# Patient Record
Sex: Female | Born: 1975 | Race: White | Hispanic: No | Marital: Married | Smoking: Never smoker
Health system: Southern US, Community
[De-identification: ages and names within clinical notes are randomized; demographics above are authoritative.]

## PROBLEM LIST (undated history)

## (undated) ENCOUNTER — Emergency Department (HOSPITAL_COMMUNITY): Disposition: A | Discharge status: Home / Self Care | Payer: BLUE CROSS/BLUE SHIELD

## (undated) DIAGNOSIS — T7840XA Allergy, unspecified, initial encounter: Secondary | ICD-10-CM

## (undated) DIAGNOSIS — E785 Hyperlipidemia, unspecified: Secondary | ICD-10-CM

## (undated) DIAGNOSIS — E282 Polycystic ovarian syndrome: Secondary | ICD-10-CM

## (undated) HISTORY — DX: Hyperlipidemia, unspecified: E78.5

## (undated) HISTORY — DX: Polycystic ovarian syndrome: E28.2

## (undated) HISTORY — PX: COLONOSCOPY: SHX174

## (undated) HISTORY — DX: Allergy, unspecified, initial encounter: T78.40XA

---

## 2000-05-20 ENCOUNTER — Other Ambulatory Visit: Admission: RE | Admit: 2000-05-20 | Discharge: 2000-05-20 | Payer: Self-pay | Admitting: Obstetrics and Gynecology

## 2000-10-10 ENCOUNTER — Emergency Department (HOSPITAL_COMMUNITY): Admission: EM | Admit: 2000-10-10 | Discharge: 2000-10-11 | Payer: Self-pay | Admitting: Emergency Medicine

## 2002-08-20 ENCOUNTER — Other Ambulatory Visit: Admission: RE | Admit: 2002-08-20 | Discharge: 2002-08-20 | Payer: Self-pay | Admitting: Obstetrics and Gynecology

## 2003-08-23 ENCOUNTER — Other Ambulatory Visit: Admission: RE | Admit: 2003-08-23 | Discharge: 2003-08-23 | Payer: Self-pay | Admitting: Obstetrics and Gynecology

## 2004-09-21 ENCOUNTER — Other Ambulatory Visit: Admission: RE | Admit: 2004-09-21 | Discharge: 2004-09-21 | Payer: Self-pay | Admitting: Obstetrics and Gynecology

## 2005-08-09 ENCOUNTER — Other Ambulatory Visit: Admission: RE | Admit: 2005-08-09 | Discharge: 2005-08-09 | Payer: Self-pay | Admitting: Obstetrics and Gynecology

## 2005-10-06 ENCOUNTER — Encounter: Payer: Self-pay | Admitting: Vascular Surgery

## 2005-10-06 ENCOUNTER — Ambulatory Visit (HOSPITAL_COMMUNITY): Admission: RE | Admit: 2005-10-06 | Discharge: 2005-10-06 | Payer: Self-pay | Admitting: Obstetrics and Gynecology

## 2005-10-19 ENCOUNTER — Ambulatory Visit: Payer: Self-pay | Admitting: Family Medicine

## 2006-02-17 ENCOUNTER — Inpatient Hospital Stay (HOSPITAL_COMMUNITY): Admission: AD | Admit: 2006-02-17 | Discharge: 2006-02-21 | Payer: Self-pay | Admitting: Obstetrics and Gynecology

## 2006-07-08 ENCOUNTER — Ambulatory Visit (HOSPITAL_BASED_OUTPATIENT_CLINIC_OR_DEPARTMENT_OTHER): Admission: RE | Admit: 2006-07-08 | Discharge: 2006-07-08 | Payer: Self-pay | Admitting: Orthopedic Surgery

## 2006-07-08 HISTORY — PX: CARPAL TUNNEL RELEASE: SHX101

## 2007-06-29 ENCOUNTER — Telehealth (INDEPENDENT_AMBULATORY_CARE_PROVIDER_SITE_OTHER): Payer: Self-pay | Admitting: *Deleted

## 2007-06-30 ENCOUNTER — Ambulatory Visit: Payer: Self-pay | Admitting: Family Medicine

## 2007-06-30 DIAGNOSIS — J069 Acute upper respiratory infection, unspecified: Secondary | ICD-10-CM | POA: Insufficient documentation

## 2007-06-30 DIAGNOSIS — R05 Cough: Secondary | ICD-10-CM

## 2007-06-30 DIAGNOSIS — R059 Cough, unspecified: Secondary | ICD-10-CM | POA: Insufficient documentation

## 2007-08-02 ENCOUNTER — Telehealth (INDEPENDENT_AMBULATORY_CARE_PROVIDER_SITE_OTHER): Payer: Self-pay | Admitting: *Deleted

## 2007-08-31 ENCOUNTER — Ambulatory Visit: Payer: Self-pay | Admitting: Family Medicine

## 2007-08-31 DIAGNOSIS — E785 Hyperlipidemia, unspecified: Secondary | ICD-10-CM | POA: Insufficient documentation

## 2007-08-31 DIAGNOSIS — J309 Allergic rhinitis, unspecified: Secondary | ICD-10-CM | POA: Insufficient documentation

## 2007-09-04 ENCOUNTER — Encounter (INDEPENDENT_AMBULATORY_CARE_PROVIDER_SITE_OTHER): Payer: Self-pay | Admitting: *Deleted

## 2007-09-05 ENCOUNTER — Encounter: Payer: Self-pay | Admitting: Family Medicine

## 2007-09-12 ENCOUNTER — Encounter (INDEPENDENT_AMBULATORY_CARE_PROVIDER_SITE_OTHER): Payer: Self-pay | Admitting: *Deleted

## 2007-09-20 ENCOUNTER — Encounter: Payer: Self-pay | Admitting: Family Medicine

## 2007-12-19 ENCOUNTER — Encounter: Payer: Self-pay | Admitting: Family Medicine

## 2007-12-21 ENCOUNTER — Telehealth: Payer: Self-pay | Admitting: Family Medicine

## 2007-12-26 ENCOUNTER — Ambulatory Visit: Payer: Self-pay | Admitting: Family Medicine

## 2007-12-26 DIAGNOSIS — N63 Unspecified lump in unspecified breast: Secondary | ICD-10-CM

## 2008-01-02 ENCOUNTER — Encounter: Admission: RE | Admit: 2008-01-02 | Discharge: 2008-01-02 | Payer: Self-pay | Admitting: Family Medicine

## 2008-01-04 ENCOUNTER — Encounter (INDEPENDENT_AMBULATORY_CARE_PROVIDER_SITE_OTHER): Payer: Self-pay | Admitting: *Deleted

## 2008-01-04 LAB — CONVERTED CEMR LAB
ALT: 62 units/L — ABNORMAL HIGH (ref 0–35)
AST: 44 units/L — ABNORMAL HIGH (ref 0–37)
Albumin: 3.9 g/dL (ref 3.5–5.2)
Alkaline Phosphatase: 76 units/L (ref 39–117)
Bilirubin, Direct: 0.1 mg/dL (ref 0.0–0.3)
Cholesterol: 225 mg/dL (ref 0–200)
Direct LDL: 163.6 mg/dL
HDL: 37.2 mg/dL — ABNORMAL LOW (ref >39.0)
Total Bilirubin: 1 mg/dL (ref 0.3–1.2)
Total CHOL/HDL Ratio: 6
Total Protein: 7.1 g/dL (ref 6.0–8.3)
Triglycerides: 127 mg/dL (ref 0–149)
VLDL: 25 mg/dL (ref 0–40)

## 2008-01-24 ENCOUNTER — Telehealth (INDEPENDENT_AMBULATORY_CARE_PROVIDER_SITE_OTHER): Payer: Self-pay | Admitting: *Deleted

## 2008-03-19 ENCOUNTER — Ambulatory Visit: Payer: Self-pay | Admitting: Family Medicine

## 2008-03-22 ENCOUNTER — Ambulatory Visit (HOSPITAL_BASED_OUTPATIENT_CLINIC_OR_DEPARTMENT_OTHER): Admission: RE | Admit: 2008-03-22 | Discharge: 2008-03-22 | Payer: Self-pay | Admitting: Orthopedic Surgery

## 2008-06-21 ENCOUNTER — Encounter: Admission: RE | Admit: 2008-06-21 | Discharge: 2008-06-21 | Payer: Self-pay | Admitting: Family Medicine

## 2008-06-24 ENCOUNTER — Telehealth (INDEPENDENT_AMBULATORY_CARE_PROVIDER_SITE_OTHER): Payer: Self-pay | Admitting: *Deleted

## 2009-02-03 ENCOUNTER — Inpatient Hospital Stay (HOSPITAL_COMMUNITY): Admission: RE | Admit: 2009-02-03 | Discharge: 2009-02-06 | Payer: Self-pay | Admitting: Obstetrics and Gynecology

## 2009-02-22 ENCOUNTER — Inpatient Hospital Stay (HOSPITAL_COMMUNITY): Admission: AD | Admit: 2009-02-22 | Discharge: 2009-02-22 | Payer: Self-pay | Admitting: Obstetrics and Gynecology

## 2009-02-23 ENCOUNTER — Inpatient Hospital Stay (HOSPITAL_COMMUNITY): Admission: AD | Admit: 2009-02-23 | Discharge: 2009-02-23 | Payer: Self-pay | Admitting: Obstetrics and Gynecology

## 2009-08-07 ENCOUNTER — Encounter: Admission: RE | Admit: 2009-08-07 | Discharge: 2009-08-07 | Payer: Self-pay | Admitting: Family Medicine

## 2009-09-04 ENCOUNTER — Ambulatory Visit: Payer: Self-pay | Admitting: Family Medicine

## 2009-09-04 DIAGNOSIS — D239 Other benign neoplasm of skin, unspecified: Secondary | ICD-10-CM | POA: Insufficient documentation

## 2009-09-17 ENCOUNTER — Telehealth: Payer: Self-pay | Admitting: Family Medicine

## 2009-10-16 ENCOUNTER — Telehealth (INDEPENDENT_AMBULATORY_CARE_PROVIDER_SITE_OTHER): Payer: Self-pay | Admitting: *Deleted

## 2009-12-03 ENCOUNTER — Telehealth (INDEPENDENT_AMBULATORY_CARE_PROVIDER_SITE_OTHER): Payer: Self-pay | Admitting: *Deleted

## 2010-07-05 LAB — CONVERTED CEMR LAB
AST: 28 units/L (ref 0–37)
AST: 41 units/L — ABNORMAL HIGH (ref 0–37)
Albumin: 4.2 g/dL (ref 3.5–5.2)
BUN: 10 mg/dL (ref 6–23)
BUN: 12 mg/dL (ref 6–23)
Basophils Absolute: 0 10*3/uL (ref 0.0–0.1)
Basophils Relative: 0 % (ref 0.0–1.0)
Basophils Relative: 0.3 % (ref 0.0–3.0)
Bilirubin, Direct: 0.1 mg/dL (ref 0.0–0.3)
Bilirubin, Direct: 0.1 mg/dL (ref 0.0–0.3)
CO2: 28 meq/L (ref 19–32)
Chloride: 102 meq/L (ref 96–112)
Chloride: 105 meq/L (ref 96–112)
Creatinine, Ser: 0.8 mg/dL (ref 0.4–1.2)
Eosinophils Absolute: 0.2 10*3/uL (ref 0.0–0.6)
GFR calc Af Amer: 125 mL/min
GFR calc non Af Amer: 103 mL/min
HCT: 45.5 % (ref 36.0–46.0)
HCT: 46.2 % — ABNORMAL HIGH (ref 36.0–46.0)
Hemoglobin: 15 g/dL (ref 12.0–15.0)
Lymphocytes Relative: 37.4 % (ref 12.0–46.0)
Lymphs Abs: 2.2 10*3/uL (ref 0.7–4.0)
MCHC: 32 g/dL (ref 30.0–36.0)
Monocytes Relative: 6.2 % (ref 3.0–11.0)
Monocytes Relative: 6.3 % (ref 3.0–12.0)
Neutro Abs: 3.4 10*3/uL (ref 1.4–7.7)
Neutro Abs: 3.8 10*3/uL (ref 1.4–7.7)
Neutrophils Relative %: 54.1 % (ref 43.0–77.0)
Neutrophils Relative %: 54.5 % (ref 43.0–77.0)
Nitrite: NEGATIVE
Platelets: 214 10*3/uL (ref 150–400)
Potassium: 3.8 meq/L (ref 3.5–5.1)
Potassium: 4.1 meq/L (ref 3.5–5.1)
RBC: 5.13 M/uL — ABNORMAL HIGH (ref 3.87–5.11)
RDW: 12 % (ref 11.5–14.6)
Sodium: 141 meq/L (ref 135–145)
Total Protein: 6.8 g/dL (ref 6.0–8.3)
Total Protein: 6.9 g/dL (ref 6.0–8.3)
WBC Urine, dipstick: NEGATIVE
WBC: 6.2 10*3/uL (ref 4.5–10.5)
WBC: 7.1 10*3/uL (ref 4.5–10.5)
pH: 6.5

## 2010-07-09 NOTE — Progress Notes (Signed)
Summary: rx new dose - niaspan  Phone Note Call from Patient Call back at Home Phone [phone]   Caller: Patient Summary of Call: patient is taking niaspan 500 mg - she was told to increase dose to 1000 mg after 30 days - needs new rx  [facility] - phone [phone] Initial call taken by: Okey Regal [place],  Oct 16, 2009 9:04 AM    Prescriptions: NIASPAN 500 MG CR-TABS (NIACIN (ANTIHYPERLIPIDEMIC)) 2 by mouth at bedtime.  #60 x 1   Entered by:   Army Fossa CMA   Authorized by:   Loreen Freud DO   Signed by:   Army Fossa CMA on 10/16/2009   Method used:   Electronically to        [facility]* (retail)       [address]       [place], [place]  [address]       Ph: [phone]       Fax: [phone]   RxID:   [id number]

## 2010-07-09 NOTE — Progress Notes (Signed)
Summary: alter/med  Phone Note Call from Patient Call back at Home Phone [phone]   Caller: Patient Summary of Call: pt called back and wanting to know if it would be possible for her to try niaspan instead of the simcor that was suggested.pt use [facility] in [place]. pls advise........................Marland KitchenFelecia Deloach CMA  September 17, 2009 4:41 PM   Follow-up for Phone Call        fine--niaspan 500 mg 1 by mouth at bedtime for 1 month then go to 2 by mouth at bedtime---- I'm sure she knows about the tylenol or asa at night with low fat snack but just make sure Follow-up by: Loreen Freud DO,  September 18, 2009 8:22 AM  Additional Follow-up for Phone Call Additional follow up Details #1::        Pt is aware of all of the above. Army Fossa CMA  September 18, 2009 8:33 AM     New/Updated Medications: NIASPAN 500 MG CR-TABS (NIACIN (ANTIHYPERLIPIDEMIC)) 1 by mouth at bedtime. NIASPAN 500 MG CR-TABS (NIACIN (ANTIHYPERLIPIDEMIC)) 2 by mouth at bedtime. Prescriptions: NIASPAN 500 MG CR-TABS (NIACIN (ANTIHYPERLIPIDEMIC)) 1 by mouth at bedtime.  #30 x 0   Entered by:   Army Fossa CMA   Authorized by:   Loreen Freud DO   Signed by:   Army Fossa CMA on 09/18/2009   Method used:   Electronically to        [facility] (retail)       [address]       [place], [place]  [address]       Ph: [phone]       Fax: [phone]   RxID:   [id number]

## 2010-07-09 NOTE — Assessment & Plan Note (Signed)
Summary: CPX//PH   Vital Signs:  Patient profile:   35 year old female Height:      66.5 inches Weight:      208 pounds BMI:     33.19 Pulse rate:   83 / minute Pulse rhythm:   regular BP sitting:   124 / 78  (left arm) Cuff size:   regular  Vitals Entered By: Army Fossa CMA (September 04, 2009 8:39 AM) CC: Pt here for CPX, no pap    History of Present Illness: Pt here for cpe ---  no pap--- pt sees gyn.  No complaints.    Preventive Screening-Counseling & Management  Alcohol-Tobacco     Alcohol drinks/day: 0     Smoking Status: never     Passive Smoke Exposure: no  Caffeine-Diet-Exercise     Caffeine use/day: 0     Does Patient Exercise: no  Hep-HIV-STD-Contraception     HIV Risk: no     Dental Visit-last 6 months yes     SBE monthly: yes  Safety-Violence-Falls     Seat Belt Use: 100      Sexual History:  currently monogamous.    Current Medications (verified): 1)  None  Allergies: 1)  ! Amoxicillin 2)  ! Prednisone  Past History:  Past Medical History: Last updated: 08/31/2007 Hyperlipidemia Allergic rhinitis  Family History: Last updated: 08/31/2007 Family History High cholesterol Family History Hypertension Family History Lung cancer--PGF  Social History: Last updated: 08/31/2007 Never Smoked Occupation: Pharm. rep. Married Drug use-no Regular exercise-yes Alcohol use-no  Risk Factors: Alcohol Use: 0 (09/04/2009) Caffeine Use: 0 (09/04/2009) Exercise: no (09/04/2009)  Risk Factors: Smoking Status: never (09/04/2009) Passive Smoke Exposure: no (09/04/2009)  Past Surgical History: Caesarean section X2 Carpal tunnel release (2/08)  Family History: Reviewed history from 08/31/2007 and no changes required. Family History High cholesterol Family History Hypertension Family History Lung cancer--PGF  Social History: Reviewed history from 08/31/2007 and no changes required. Never Smoked Occupation: Pharm. rep. Married Drug  use-no Regular exercise-yes Alcohol use-no Does Patient Exercise:  no Dental Care w/in 6 mos.:  yes Sexual History:  currently monogamous  Review of Systems      See HPI General:  Denies chills, fatigue, fever, loss of appetite, malaise, sleep disorder, sweats, weakness, and weight loss. Eyes:  Denies blurring, discharge, double vision, eye irritation, eye pain, halos, itching, light sensitivity, red eye, vision loss-1 eye, and vision loss-both eyes. ENT:  Denies decreased hearing, difficulty swallowing, ear discharge, earache, hoarseness, nasal congestion, nosebleeds, postnasal drainage, ringing in ears, sinus pressure, and sore throat. CV:  Denies bluish discoloration of lips or nails, chest pain or discomfort, difficulty breathing at night, difficulty breathing while lying down, fainting, fatigue, leg cramps with exertion, lightheadness, near fainting, palpitations, shortness of breath with exertion, swelling of feet, swelling of hands, and weight gain. Resp:  Denies chest discomfort, chest pain with inspiration, cough, coughing up blood, excessive snoring, hypersomnolence, morning headaches, pleuritic, shortness of breath, sputum productive, and wheezing. GI:  Denies abdominal pain, bloody stools, change in bowel habits, constipation, dark tarry stools, diarrhea, excessive appetite, gas, hemorrhoids, indigestion, loss of appetite, nausea, vomiting, vomiting blood, and yellowish skin color. GU:  Denies abnormal vaginal bleeding, decreased libido, discharge, dysuria, genital sores, hematuria, incontinence, nocturia, urinary frequency, and urinary hesitancy. MS:  Denies joint pain, joint redness, joint swelling, loss of strength, low back pain, mid back pain, muscle aches, muscle , cramps, muscle weakness, stiffness, and thoracic pain. Derm:  Denies changes in color of  skin, changes in nail beds, dryness, excessive perspiration, flushing, hair loss, insect bite(s), itching, lesion(s), poor wound  healing, and rash. Neuro:  Denies brief paralysis, difficulty with concentration, disturbances in coordination, falling down, headaches, inability to speak, memory loss, numbness, poor balance, seizures, sensation of room spinning, tingling, tremors, visual disturbances, and weakness. Psych:  Denies alternate hallucination ( auditory/visual), anxiety, depression, easily angered, easily tearful, irritability, mental problems, panic attacks, sense of great danger, suicidal thoughts/plans, thoughts of violence, unusual visions or sounds, and thoughts /plans of harming others. Endo:  Denies cold intolerance, excessive hunger, excessive thirst, excessive urination, heat intolerance, polyuria, and weight change. Heme:  Denies abnormal bruising, bleeding, enlarge lymph nodes, fevers, pallor, and skin discoloration. Allergy:  Denies hives or rash, itching eyes, persistent infections, seasonal allergies, and sneezing.  Physical Exam  General:  Well-developed,well-nourished,in no acute distress; alert,appropriate and cooperative throughout examination Head:  Normocephalic and atraumatic without obvious abnormalities. No apparent alopecia or balding. Eyes:  vision grossly intact, pupils equal, pupils round, pupils reactive to light, and no injection.   Ears:  External ear exam shows no significant lesions or deformities.  Otoscopic examination reveals clear canals, tympanic membranes are intact bilaterally without bulging, retraction, inflammation or discharge. Hearing is grossly normal bilaterally. Nose:  External nasal examination shows no deformity or inflammation. Nasal mucosa are pink and moist without lesions or exudates. Mouth:  Oral mucosa and oropharynx without lesions or exudates.  Teeth in good repair. Neck:  No deformities, masses, or tenderness noted.no carotid bruits.   Chest Wall:  No deformities, masses, or tenderness noted. Breasts:  gyn Lungs:  Normal respiratory effort, chest expands  symmetrically. Lungs are clear to auscultation, no crackles or wheezes. Heart:  normal rate and no murmur.   Abdomen:  Bowel sounds positive,abdomen soft and non-tender without masses, organomegaly or hernias noted. Genitalia:  gyn Msk:  normal ROM, no joint tenderness, no joint swelling, no joint warmth, no redness over joints, no joint deformities, no joint instability, and no crepitation.   Pulses:  R posterior tibial normal, R dorsalis pedis normal, and R carotid normal.   Extremities:  No clubbing, cyanosis, edema, or deformity noted with normal full range of motion of all joints.   Neurologic:  alert & oriented X3, cranial nerves II-XII intact, strength normal in all extremities, and gait normal.   Skin:  Intact without suspicious lesions or rashes Cervical Nodes:  No lymphadenopathy noted Axillary Nodes:  No palpable lymphadenopathy Psych:  Cognition and judgment appear intact. Alert and cooperative with normal attention span and concentration. No apparent delusions, illusions, hallucinations   Impression & Recommendations:  Problem # 1:  PREVENTIVE HEALTH CARE (ICD-V70.0) GHM utd check labs Orders: Venipuncture (86578) TLB-BMP (Basic Metabolic Panel-BMET) (80048-METABOL) TLB-CBC Platelet - w/Differential (85025-CBCD) TLB-Hepatic/Liver Function Pnl (80076-HEPATIC) TLB-TSH (Thyroid Stimulating Hormone) (84443-TSH)  Problem # 2:  HYPERLIPIDEMIA (ICD-272.4)  Orders: Venipuncture (46962) TLB-BMP (Basic Metabolic Panel-BMET) (80048-METABOL) TLB-CBC Platelet - w/Differential (85025-CBCD) TLB-Hepatic/Liver Function Pnl (80076-HEPATIC) TLB-TSH (Thyroid Stimulating Hormone) (84443-TSH) T-NMR, Lipoprofile (95[address]-13244)  Labs Reviewed: SGOT: 44 (12/26/2007)   SGPT: 62 (12/26/2007)   HDL:37.2 (12/26/2007)  LDL:DEL (12/26/2007)  Chol:225 (12/26/2007)  Trig:127 (12/26/2007)  Other Orders: Dermatology Referral (Derma)  Appended Document: CPX//PH  Laboratory Results   Urine  Tests   Date/Time Reported: September 04, 2009 1:16 PM   Routine Urinalysis   Color: yellow Appearance: Clear Glucose: negative   (Normal Range: Negative) Bilirubin: negative   (Normal Range: Negative) Ketone: negative   (Normal Range: Negative) Spec. Gravity: <  1.005   (Normal Range: 1.003-1.035) Blood: negative   (Normal Range: Negative) pH: 6.0   (Normal Range: 5.0-8.0) Protein: negative   (Normal Range: Negative) Urobilinogen: 0.2   (Normal Range: 0-1) Nitrite: negative   (Normal Range: Negative) Leukocyte Esterace: negative   (Normal Range: Negative)    Comments: Floydene Flock  September 04, 2009 1:16 PM

## 2010-07-09 NOTE — Progress Notes (Signed)
Summary: PINK EYE  Phone Note Call from Patient Call back at Home Phone [phone]   Caller: Patient Summary of Call: PT STATES SHE HAS PINK EYE. 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 SHE WANTS TO KNOW IF SHE CAN HAVE AN RX CALLED IN FOR THIS. PLEASE CONTACT HER AT [phone]. Initial call taken by: Lavell Islam,  December 03, 2009 10:54 AM  Follow-up for Phone Call        Informed pt that OV would be need for rx to be called in pt would like for me to check with another physician to see if it would be possible to get rx sent in without OV......Marland KitchenFelecia Deloach CMA  December 03, 2009 11:21 AM   pt called and stated that the rx needs to go to [facility] pharmacy phone number [phone]. Follow-up by: Lavell Islam,  December 03, 2009 12:23 PM  Additional Follow-up for Phone Call Additional follow up Details #1::        ok for vigamox.  if no improvement pt will need OV Additional Follow-up by: Neena Rhymes MD,  December 03, 2009 11:34 AM    Additional Follow-up for Phone Call Additional follow up Details #2::    left message to call  office....Marland KitchenMarland KitchenFelecia Deloach CMA  December 03, 2009 11:47 AM   rx cancel and sent to correct pharmacy, left pt detail message rx sent and OVINB.......Marland KitchenFelecia Deloach CMA  December 03, 2009 1:14 PM   New/Updated Medications: VIGAMOX 0.5 % SOLN (MOXIFLOXACIN HCL) 1 drop in affected eye three times a day x7 days. Prescriptions: VIGAMOX 0.5 % SOLN (MOXIFLOXACIN HCL) 1 drop in affected eye three times a day x7 days.  #87ml x 0   Entered by:   Jeremy Johann CMA   Authorized by:   Neena Rhymes MD   Signed by:   Jeremy Johann CMA on 12/03/2009   Method used:   Faxed to ...       [facility]* (retail)       [address]       [place], [place]  [address]       Ph: [phone]       Fax: [phone]   RxID:   [id number] VIGAMOX 0.5 % SOLN (MOXIFLOXACIN HCL) 1 drop in affected eye three times a day x7 days.  #70ml x  0   Entered and Authorized by:   Neena Rhymes MD   Signed by:   Neena Rhymes MD on 12/03/2009   Method used:   Electronically to        [facility] (retail)       [address]       [place], [place]  [address]       Ph: [phone]       Fax: [phone]   RxID:   [id number]

## 2010-07-27 ENCOUNTER — Telehealth (INDEPENDENT_AMBULATORY_CARE_PROVIDER_SITE_OTHER): Payer: Self-pay | Admitting: *Deleted

## 2010-08-04 NOTE — Progress Notes (Signed)
Summary: Vaccinations  Phone Note Call from Patient Call back at Home Phone [phone]   Caller: Patient Summary of Call: pt is going on a mission trip to [place] and will need vaccinations. Please contact patient at [phone]. Pt has a list of the ones she needs but she didnt know if there are any others that she needed. Pt really just wants the MD's opinion.  Initial call taken by: Lavell Islam,  July 27, 2010 11:08 AM  Follow-up for Phone Call        Pt given travel clinic number....Marland KitchenMarland KitchenFelecia Deloach CMA  July 27, 2010 11:28 AM

## 2010-08-13 ENCOUNTER — Encounter: Payer: Self-pay | Admitting: Family Medicine

## 2010-08-13 ENCOUNTER — Other Ambulatory Visit: Payer: Self-pay | Admitting: Family Medicine

## 2010-08-13 ENCOUNTER — Ambulatory Visit (HOSPITAL_BASED_OUTPATIENT_CLINIC_OR_DEPARTMENT_OTHER)
Admission: RE | Admit: 2010-08-13 | Discharge: 2010-08-13 | Disposition: A | Discharge status: Home / Self Care | Payer: Managed Care, Other (non HMO) | Source: Ambulatory Visit | Attending: Family Medicine | Admitting: Family Medicine

## 2010-08-13 ENCOUNTER — Ambulatory Visit (INDEPENDENT_AMBULATORY_CARE_PROVIDER_SITE_OTHER): Payer: Managed Care, Other (non HMO) | Admitting: Family Medicine

## 2010-08-13 DIAGNOSIS — J40 Bronchitis, not specified as acute or chronic: Secondary | ICD-10-CM | POA: Insufficient documentation

## 2010-08-13 DIAGNOSIS — J209 Acute bronchitis, unspecified: Secondary | ICD-10-CM | POA: Insufficient documentation

## 2010-08-13 DIAGNOSIS — R0602 Shortness of breath: Secondary | ICD-10-CM | POA: Insufficient documentation

## 2010-08-18 NOTE — Assessment & Plan Note (Signed)
Summary: resp. problem/kn   Vital Signs:  Patient profile:   35 year old female Height:      66.5 inches Weight:      202.4 pounds BMI:     32.30 Temp:     97.6 degrees F oral BP sitting:   120 / 76  (right arm) Cuff size:   large  Vitals Entered By: Almeta Monas CMA Duncan Dull) (August 13, 2010 1:20 PM) CC: x 8 weeks c/o Uri with cough----took biaxin for 2 weeks with no relief, Cough   History of Present Illness:  Cough      This is a 35 year old woman who presents with Cough.  The symptoms began 4-8 weeks ago.  The patient reports productive cough, wheezing, and fever, but denies pleuritic chest pain, shortness of breath, exertional dyspnea, hemoptysis, and malaise.  The patient denies the following symptoms: cold/URI symptoms, sore throat, nasal congestion, chronic rhinitis, weight loss, acid reflux symptoms, and peripheral edema.  The cough is worse with activity and lying down.  Ineffective prior treatments have included OTC cough medication.    Current Medications (verified): 1)  Avelox 400 Mg Tabs (Moxifloxacin Hcl) .Marland Kitchen.. 1 By Mouth Once Daily 2)  Proair Hfa 108 (90 Base) Mcg/act Aers (Albuterol Sulfate) .... 2 Puffs Qid As Needed  Allergies (verified): 1)  ! Amoxicillin 2)  ! Prednisone  Past History:  Past medical, surgical, family and social histories (including risk factors) reviewed for relevance to current acute and chronic problems.  Past Medical History: Reviewed history from 08/31/2007 and no changes required. Hyperlipidemia Allergic rhinitis  Past Surgical History: Reviewed history from 09/04/2009 and no changes required. Caesarean section X2 Carpal tunnel release (2/08)  Family History: Reviewed history from 08/31/2007 and no changes required. Family History High cholesterol Family History Hypertension Family History Lung cancer--PGF  Social History: Reviewed history from 08/31/2007 and no changes required. Never Smoked Occupation: Pharm.  rep. Married Drug use-no Regular exercise-yes Alcohol use-no  Review of Systems      See HPI  Physical Exam  General:  Well-developed,well-nourished,in no acute distress; alert,appropriate and cooperative throughout examination Ears:  External ear exam shows no significant lesions or deformities.  Otoscopic examination reveals clear canals, tympanic membranes are intact bilaterally without bulging, retraction, inflammation or discharge. Hearing is grossly normal bilaterally. Nose:  External nasal examination shows no deformity or inflammation. Nasal mucosa are pink and moist without lesions or exudates. Mouth:  Oral mucosa and oropharynx without lesions or exudates.  Teeth in good repair. Neck:  No deformities, masses, or tenderness noted. Lungs:  R decreased breath sounds and L decreased breath sounds.   Heart:  Normal rate and regular rhythm. S1 and S2 normal without gallop, murmur, click, rub or other extra sounds. Psych:  Cognition and judgment appear intact. Alert and cooperative with normal attention span and concentration. No apparent delusions, illusions, hallucinations   Impression & Recommendations:  Problem # 1:  ACUTE BRONCHITIS (ICD-466.0)  Her updated medication list for this problem includes:    Avelox 400 Mg Tabs (Moxifloxacin hcl) .Marland Kitchen... 1 by mouth once daily    Proair Hfa 108 (90 Base) Mcg/act Aers (Albuterol sulfate) .Marland Kitchen... 2 puffs qid as needed  Orders: T-2 View CXR (71020TC)  Take antibiotics and other medications as directed. Encouraged to push clear liquids, get enough rest, and take acetaminophen as needed. To be seen in 5-7 days if no improvement, sooner if worse.  Complete Medication List: 1)  Avelox 400 Mg Tabs (Moxifloxacin hcl) .Marland KitchenMarland KitchenMarland Kitchen  1 by mouth once daily 2)  Proair Hfa 108 (90 Base) Mcg/act Aers (Albuterol sulfate) .... 2 puffs qid as needed Prescriptions: PROAIR HFA 108 (90 BASE) MCG/ACT AERS (ALBUTEROL SULFATE) 2 puffs qid as needed  #1 x 1    Entered and Authorized by:   Loreen Freud DO   Signed by:   Loreen Freud DO on 08/13/2010   Method used:   Electronically to        [facility]* (retail)       [address]       [place], [place]  [address]       Ph: [phone]       Fax: [phone]   RxID:   [id number] PREDNISONE 10 MG TABS (PREDNISONE) 3 by mouth once daily for 3 days then 2 by mouth once daily for 3 days then 1 by mouth once daily for 3 days  #18 x 0   Entered and Authorized by:   Loreen Freud DO   Signed by:   Loreen Freud DO on 08/13/2010   Method used:   Electronically to        [facility]* (retail)       [address]       [place], [place]  [address]       Ph: [phone]       Fax: [phone]   RxID:   [id number] AVELOX 400 MG TABS (MOXIFLOXACIN HCL) 1 by mouth once daily  #10 x 0   Entered and Authorized by:   Loreen Freud DO   Signed by:   Loreen Freud DO on 08/13/2010   Method used:   Electronically to        [facility]* (retail)       [address]       [place], [place]  [address]       Ph: [phone]       Fax: [phone]   RxID:   [id number]    Orders Added: 1)  T-2 View CXR [71020TC] 2)  Est. Patient Level III [51761]

## 2010-08-19 ENCOUNTER — Encounter: Payer: Self-pay | Admitting: Family Medicine

## 2010-09-07 ENCOUNTER — Encounter: Payer: Self-pay | Admitting: Family Medicine

## 2010-09-07 ENCOUNTER — Ambulatory Visit (INDEPENDENT_AMBULATORY_CARE_PROVIDER_SITE_OTHER): Payer: Managed Care, Other (non HMO) | Admitting: Family Medicine

## 2010-09-07 VITALS — BP 112/70 | HR 88 | Ht 66.0 in | Wt 199.8 lb

## 2010-09-07 DIAGNOSIS — Z23 Encounter for immunization: Secondary | ICD-10-CM

## 2010-09-07 DIAGNOSIS — Z0000 Encounter for general adult medical examination without abnormal findings: Secondary | ICD-10-CM | POA: Insufficient documentation

## 2010-09-07 LAB — POCT URINALYSIS DIPSTICK
Blood, UA: NEGATIVE
Clarity, UA: NEGATIVE
Leukocytes, UA: NEGATIVE
Protein, UA: NEGATIVE
Spec Grav, UA: 1.02
Urobilinogen, UA: NEGATIVE
pH, UA: 6

## 2010-09-07 LAB — BASIC METABOLIC PANEL
BUN: 15 mg/dL (ref 6–23)
CO2: 28 mEq/L (ref 19–32)
Chloride: 99 mEq/L (ref 96–112)
Glucose, Bld: 75 mg/dL (ref 70–99)
Potassium: 3.9 mEq/L (ref 3.5–5.1)
Sodium: 137 mEq/L (ref 135–145)

## 2010-09-07 LAB — CBC WITH DIFFERENTIAL/PLATELET
Basophils Absolute: 0 10*3/uL (ref 0.0–0.1)
Eosinophils Relative: 2.6 % (ref 0.0–5.0)
HCT: 44.5 % (ref 36.0–46.0)
Hemoglobin: 15.4 g/dL — ABNORMAL HIGH (ref 12.0–15.0)
MCV: 89.5 fl (ref 78.0–100.0)

## 2010-09-07 LAB — HEPATIC FUNCTION PANEL
ALT: 60 U/L — ABNORMAL HIGH (ref 0–35)
AST: 37 U/L (ref 0–37)
Albumin: 4.4 g/dL (ref 3.5–5.2)
Alkaline Phosphatase: 71 U/L (ref 39–117)
Bilirubin, Direct: 0.1 mg/dL (ref 0.0–0.3)
Total Bilirubin: 0.7 mg/dL (ref 0.3–1.2)
Total Protein: 7.1 g/dL (ref 6.0–8.3)

## 2010-09-07 LAB — LDL CHOLESTEROL, DIRECT: Direct LDL: 170.4 mg/dL

## 2010-09-07 LAB — LIPID PANEL
HDL: 49.6 mg/dL (ref >39.00)
Total CHOL/HDL Ratio: 5
VLDL: 26.6 mg/dL (ref 0.0–40.0)

## 2010-09-07 MED ORDER — TYPHOID VACCINE PO CPDR: 4.0000 | DELAYED_RELEASE_CAPSULE | ORAL | Status: AC

## 2010-09-07 MED ORDER — ATOVAQUONE-PROGUANIL HCL 250-100 MG PO TABS: ORAL_TABLET | ORAL | Status: DC

## 2010-09-07 MED ORDER — EPINEPHRINE 0.3 MG/0.3ML IJ DEVI: 0.3000 mg | Freq: Once | INTRAMUSCULAR | Status: AC

## 2010-09-07 MED ORDER — PNEUMOCOCCAL VAC POLYVALENT 25 MCG/0.5ML IJ INJ: 0.5000 mL | INJECTION | Freq: Once | INTRAMUSCULAR | Status: DC

## 2010-09-07 MED ORDER — TETANUS-DIPHTH-ACELL PERTUSSIS 5-2.5-18.5 LF-MCG/0.5 IM SUSP: 0.5000 mL | Freq: Once | INTRAMUSCULAR | Status: DC

## 2010-09-07 NOTE — Progress Notes (Signed)
  Subjective:    Patient ID: Jill Horn, female    DOB: Feb 11, 1976, 35 y.o.   MRN: [id number]  HPI    Review of Systems     Objective:   Physical Exam        Assessment & Plan:   Subjective:     Jill Horn is a 35 y.o. female and is here for a comprehensive physical exam. The patient reports no problems.  History   Social History  . Marital Status: Married    Spouse Name: N/A    Number of Children: N/A  . Years of Education: N/A   Occupational History  . Pharm Rep    Social History Main Topics  . Smoking status: Never Smoker   . Smokeless tobacco: Never Used  . Alcohol Use: No  . Drug Use: No  . Sexually Active: Yes    Birth Control/ Protection: None   Other Topics Concern  . Not on file   Social History Narrative  . No narrative on file   Health Maintenance  Topic Date Due  . Pap Smear  09/16/1992  . Tetanus/tdap  08/30/2017    The following portions of the patient's history were reviewed and updated as appropriate: allergies, current medications, past family history, past medical history, past social history, past surgical history and problem list.  Review of Systems A comprehensive review of systems was negative.   Objective:    BP 112/70  Pulse 88  Ht 5\' 6"  (1.676 m)  Wt 199 lb 12.8 oz (90.629 kg)  BMI 32.25 kg/m2  LMP 08/25/2010 General appearance: alert, cooperative, appears stated age and no distress Head: Normocephalic, without obvious abnormality, atraumatic Eyes: conjunctivae/corneas clear. PERRL, EOM's intact. Fundi benign. Ears: normal TM's and external ear canals both ears Nose: Nares normal. Septum midline. Mucosa normal. No drainage or sinus tenderness. Throat: lips, mucosa, and tongue normal; teeth and gums normal Neck: no adenopathy, no carotid bruit, no JVD, supple, symmetrical, trachea midline and thyroid not enlarged, symmetric, no tenderness/mass/nodules Lungs: clear to auscultation bilaterally Heart:  regular rate and rhythm, S1, S2 normal, no murmur, click, rub or gallop Abdomen: soft, non-tender; bowel sounds normal; no masses,  no organomegaly Extremities: extremities normal, atraumatic, no cyanosis or edema Pulses: 2+ and symmetric Skin: Skin color, texture, turgor normal. No rashes or lesions Lymph nodes: Cervical, supraclavicular, and axillary nodes normal.    Assessment:    Healthy female exam.      Plan:     See After Visit Summary for Counseling Recommendations

## 2010-09-07 NOTE — Assessment & Plan Note (Signed)
Check labs ghm utd Immunizations reviewed with pt for [place] trip

## 2010-09-08 ENCOUNTER — Encounter: Payer: Self-pay | Admitting: *Deleted

## 2010-09-08 LAB — VARICELLA ZOSTER ANTIBODY, IGG: Varicella IgG: 4.03 {ISR} — ABNORMAL HIGH

## 2010-09-08 LAB — RUBEOLA ANTIBODY IGG: Rubeola IgG: 1.73 {ISR} — ABNORMAL HIGH

## 2010-09-09 ENCOUNTER — Encounter: Payer: Self-pay | Admitting: *Deleted

## 2010-09-12 LAB — CBC
Hemoglobin: 13.9 g/dL (ref 12.0–15.0)
MCHC: 34 g/dL (ref 30.0–36.0)
Platelets: 134 10*3/uL — ABNORMAL LOW (ref 150–400)
RBC: 4.13 MIL/uL (ref 3.87–5.11)
RBC: 4.35 MIL/uL (ref 3.87–5.11)
RDW: 14.3 % (ref 11.5–15.5)

## 2010-09-12 LAB — CCBB MATERNAL DONOR DRAW

## 2010-09-12 LAB — RPR: RPR Ser Ql: NONREACTIVE

## 2010-09-15 ENCOUNTER — Other Ambulatory Visit (INDEPENDENT_AMBULATORY_CARE_PROVIDER_SITE_OTHER): Payer: Managed Care, Other (non HMO)

## 2010-09-15 DIAGNOSIS — Z0000 Encounter for general adult medical examination without abnormal findings: Secondary | ICD-10-CM

## 2010-09-15 LAB — HEPATIC FUNCTION PANEL
AST: 36 U/L (ref 0–37)
Alkaline Phosphatase: 73 U/L (ref 39–117)
Total Bilirubin: 0.8 mg/dL (ref 0.3–1.2)
Total Protein: 6.9 g/dL (ref 6.0–8.3)

## 2010-09-15 LAB — GAMMA GT: GGT: 22 U/L (ref 7–51)

## 2010-09-17 ENCOUNTER — Encounter: Payer: Self-pay | Admitting: *Deleted

## 2010-09-17 ENCOUNTER — Telehealth: Payer: Self-pay | Admitting: *Deleted

## 2010-09-17 NOTE — Telephone Encounter (Signed)
Pt aware, copy of labs given to Pt

## 2010-09-17 NOTE — Telephone Encounter (Addendum)
Left message to call office   Message copied by Candie Echevaria on Thu Sep 17, 2010 10:43 AM ------      Message from: Loreen Freud      Created: Tue Sep 15, 2010  5:18 PM       Better but still elevated      [place] abd if not already done

## 2010-09-22 ENCOUNTER — Other Ambulatory Visit: Payer: Managed Care, Other (non HMO)

## 2010-09-23 ENCOUNTER — Ambulatory Visit
Admission: RE | Admit: 2010-09-23 | Discharge: 2010-09-23 | Disposition: A | Discharge status: Home / Self Care | Payer: Managed Care, Other (non HMO) | Source: Ambulatory Visit | Attending: Family Medicine | Admitting: Family Medicine

## 2010-09-23 ENCOUNTER — Other Ambulatory Visit: Payer: Managed Care, Other (non HMO)

## 2010-09-23 ENCOUNTER — Encounter: Payer: Self-pay | Admitting: *Deleted

## 2010-09-23 ENCOUNTER — Telehealth: Payer: Self-pay | Admitting: *Deleted

## 2010-09-23 NOTE — Telephone Encounter (Signed)
Pt aware of results  [place] ---fatty liver. Weight loss and exercise will help this.  ----- Message ----- From: Candie Echevaria, CMA Sent: 09/23/2010 1:13 PM To: Lelon Perla, DO

## 2010-09-24 ENCOUNTER — Telehealth: Payer: Self-pay

## 2010-09-24 MED ORDER — FLUTICASONE PROPIONATE 50 MCG/ACT NA SUSP: 2.0000 | Freq: Every day | NASAL | Status: DC

## 2010-09-24 MED ORDER — CLARITHROMYCIN ER 500 MG PO TB24: 1000.0000 mg | ORAL_TABLET | Freq: Every day | ORAL | Status: AC

## 2010-09-24 NOTE — Telephone Encounter (Signed)
Call from patient and she stated she had a URI with cough, teeth pain and sinus issues.  Stated she is leaving for a mission trip on Sunday and doesn't want to be sick. Would like Rx called to [facility]    Please advise    KP

## 2010-09-24 NOTE — Telephone Encounter (Signed)
flonase 2 sprays each nostril qd  #1 biaxin xl  2 po qd for 14 days

## 2010-09-24 NOTE — Telephone Encounter (Signed)
Pt aware rx sent to pharmacy.

## 2010-10-01 ENCOUNTER — Telehealth: Payer: Self-pay | Admitting: *Deleted

## 2010-10-01 MED ORDER — MOXIFLOXACIN HCL 400 MG PO TABS: 400.0000 mg | ORAL_TABLET | Freq: Every day | ORAL | Status: AC

## 2010-10-01 NOTE — Telephone Encounter (Signed)
avelox 400 1 po qd  When is she leaving?  Can she come in for steroids if no better?

## 2010-10-01 NOTE — Telephone Encounter (Signed)
Pt states that she is on the 7th day of biaxin and will finish med today. Pt still c/o green thick mucous, congestion, teeth aching, and sinus issues. Pt would like to know if she needs to get another round of med or what is the next step since she is still no better. Please advise .

## 2010-10-20 NOTE — Op Note (Signed)
Jill Horn, Jill Horn         ACCOUNT NO.:  [id number]   MEDICAL RECORD NO.:  [id number]          PATIENT TYPE:  AMB   LOCATION:  DSC                          FACILITY:  [facility]   PHYSICIAN:  Jill Horn, M.D. DATE OF BIRTH:  [birth year]   DATE OF PROCEDURE:  03/22/2008  DATE OF DISCHARGE:                               OPERATIVE REPORT   PREOPERATIVE DIAGNOSIS:  Entrapment neuropathy, median nerve, right  carpal tunnel.   POSTOPERATIVE DIAGNOSIS:  Entrapment neuropathy, median nerve, right  carpal tunnel.   OPERATION:  Release of right transverse carpal ligament.   OPERATING SURGEON:  Jill Fitch. Sypher, MD   ASSISTANT:  Marveen Reeks Dasnoit, PA-C   ANESTHESIA:  General by LMA.   SUPERVISING ANESTHESIOLOGIST:  Zenon Mayo, MD   INDICATIONS:  Jill Horn is a 32-year woman familiar with  our practice.  She is status post left carpal tunnel release in 2007.  Electrodiagnostic studies in 2007 confirmed bilateral carpal tunnel  syndrome.  She now returns requesting similar surgery on the right.  Her  preoperative clinical exam confirmed median neuropathy at wrist level.   After informed consent, she is brought to the operating room at this  time.   PROCEDURE IN DETAIL:  Jill Horn is brought to the operating  room and placed in supine position upon the operating table.  Following  an anesthesia consult with Dr. Sampson Goon, general anesthesia by LMA was  recommended and accepted by Jill Horn.   Following induction of general anesthesia under Dr. Jarrett Ables direct  supervision, the right arm was prepped with Betadine soap and solution  and sterilely draped.  A pneumatic tourniquet was applied to the  proximal brachium.  Following exsanguination of the right arm with an  Esmarch bandage, the arterial tourniquet was inflated to 220 mmHg.  The  procedure commenced with short incision in line of the ring finger of  the palm.  Subcutaneous tissues  were carefully divided revealing the  palmar fascia.  This was split longitudinally to reveal the common  sensory branch of the median nerve.  These were followed back to the  transverse carpal ligament which was gently isolated from the median  nerve.  Ligament was then released along its ulnar border extending into  the distal forearm.   This widely opened the carpal canal.  No mass or predicaments were  noted.  Bleeding points along margin of the released ligament were  electrocauterized with bipolar current followed by repair of the skin  with intradermal 3-0 Prolene suture.   A compressive dressing was applied with a volar plaster splint  maintaining the wrist in 5 degrees of dorsiflexion.   For aftercare, Jill Horn is provided Darvocet-N 100 one p.o. q.6  h. p.r.n. pain, 20 tablets without refill.   She will use Tylenol and Advil as necessary.   She will return to see [place] for followup in 1 week.      Jill Fitch Horn, M.D.  Electronically Signed     RVS/MEDQ  D:  03/22/2008  T:  03/22/2008  Job:  [id number]

## 2010-10-20 NOTE — Op Note (Signed)
NAMENEJLA, REASOR          ACCOUNT NO.:  [id number]   MEDICAL RECORD NO.:  [id number]          PATIENT TYPE:  INP   LOCATION:  9125                          FACILITY:  [facility]   PHYSICIAN:  Michelle L. Grewal, M.D.DATE OF BIRTH:  February 12, 1976   DATE OF PROCEDURE:  02/03/2009  DATE OF DISCHARGE:                               OPERATIVE REPORT   PREOPERATIVE DIAGNOSES:  Intrauterine pregnancy at 39 weeks and previous  cesarean section.   POSTOPERATIVE DIAGNOSES:  Intrauterine pregnancy at 39 weeks and  previous cesarean section.   PROCEDURE:  Repeat low-transverse cesarean section.   SURGEON:  Michelle L. Grewal, MD   ANESTHESIA:  Spinal.   FINDINGS:  Female infant in cephalic presentation Apgars 8 at 1 and 9 at 5  minutes.   ESTIMATED BLOOD LOSS:  500 mL.   COMPLICATIONS:  None.   PROCEDURE:  The patient was then taken to the operating room.  Spinal  was placed.  She was prepped and draped in the usual sterile fashion.  Foley catheter was inserted.  A low-transverse incision was made and  carried down to the fascia.  Fascia scored in the midline extended  laterally.  Rectus muscles were separated in the midline.  The  peritoneum was entered bluntly.  The peritoneal incision was then  stretched.  The bladder blade was inserted.  The lower uterine segment  was identified and the bladder flap was created sharply and then  digitally.  The bladder blade was readjusted.  A low-transverse incision  was made in the uterus.  Uterus was entered using a hemostat.  The baby  was in cephalic presentation, was delivered easily with one pull of the  vacuum.  The baby was a female infant.  Apgars were 8 at 1 minute 9 at 5  minutes.  The cord was clamped and cut and the baby was handed to the  awaiting pediatricians.  The baby was then taken to the newborn nursery  after the 5 minute Apgar.  The placenta was manually removed, noted to  be normal, intact with a 3-vessel cord.  The uterus was  exteriorized and  cleared of all clots and debris.  Antibiotics and Pitocin were given.  Adnexa were normal.  The uterine incision was closed in one layer using  O chromic in running locked stitch.  The uterus was returned to the  abdomen.  Irrigation was performed.  Hemostasis was excellent.  The  peritoneum and rectus muscles were reapproximated using O Vicryl.  The  fascia was closed using O Vicryl running stitch x2 starting each corner  meeting in the midline.  After irrigation of  the  subcutaneous layer, the skin was closed with subcuticular using 3-0  Vicryl and the skin was closed with Dermabond skin adhesive.  All  sponge, lap, and instrument counts were correct x2.  The patient went to  recovery room in stable condition.      Michelle L. Vincente Poli, M.D.  Electronically Signed     MLG/MEDQ  D:  02/03/2009  T:  02/03/2009  Job:  [id number]

## 2010-10-23 NOTE — Op Note (Signed)
Jill Horn, Jill Horn                  ACCOUNT NO.:  [id number]   MEDICAL RECORD NO.:  [id number]          PATIENT TYPE:  AMB   LOCATION:  DSC                          FACILITY:  [facility]   PHYSICIAN:  Katy Fitch. Sypher, M.D. 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   OPERATIONS:  Release of left transverse carpal ligament.   OPERATIONS:  Katy Fitch. Sypher, M.D.   ASSISTANT:  Molly Maduro Dasnoit PA-C.   ANESTHESIA:  General by LMA.   SUPERVISING ANESTHESIOLOGIST:  Germaine Pomfret, M.D.   INDICATIONS:  [name] is a 35 year old woman referred through the  courtesy of Dr. Marcelle Overlie for evaluation and management of hand  numbness and discomfort.   [name] had experienced perinatal carpal tunnel syndrome.  Following  delivery, she has not had resolution of her symptoms.  She has failed  nonoperative measures.  She is brought to the operating room at this  time for release of the left transverse carpal ligament.   Preoperatively, she is advised of potential risks and benefits of  surgery.   After questions were invited and answered, she is brought to the  operating room at this time.   PROCEDURE:  [name] is brought to the operating room and placed in  supine position upon the operating table.   Following the induction of general anesthesia by LMA technique, the left  arm was prepped with Betadine soap and solution and sterilely draped.  Following exsanguination of the left arm with an Esmarch bandage, an  arterial tourniquet on the proximal brachium is inflated to 220 mmHg.  The procedure commenced with a short incision in the line of the ring  finger and the palm.  Subcutaneous tissues are carefully divided  revealing the palmar fascia.  This is split longitudinally to reveal the  common sensory branch of the median nerve.   These are followed back to the transverse carpal ligament which is  gently isolated from median nerve.   Once the nerve was carefully isolated from the ligament, the ligament  was released subcutaneously extending into the distal forearm.   This widely opened the carpal canal.   No mass or other predicaments are noted.   Bleeding points along the margin of the released ligament are  electrocauterized with bipolar current, followed by repair of the skin  with intradermal 3-0 Prolene suture.   A compressive dressing is applied with a volar plaster splint  maintaining the wrist in 5 degrees of dorsiflexion.   For aftercare, [name] is given a prescription for Ultram 50 mg one  p.o. q.4-6h. p.r.n. pain 20 tablets without refill.  She will also use  Tylenol or Motrin as needed.      Katy Fitch Sypher, M.D.  Electronically Signed     RVS/MEDQ  D:  07/08/2006  T:  07/08/2006  Job:  [id number]   cc:   Marcelino Duster L. Vincente Poli, M.D.

## 2010-10-23 NOTE — Op Note (Signed)
Jill Horn, SCHERMER                  ACCOUNT NO.:  [id number]   MEDICAL RECORD NO.:  [id number]          PATIENT TYPE:  INP   LOCATION:  9140                          FACILITY:  [facility]   PHYSICIAN:  Jill L. Grewal, M.D.DATE OF BIRTH:  March 07, 1976   DATE OF PROCEDURE:  02/18/2006  DATE OF DISCHARGE:                                 OPERATIVE REPORT   PREOPERATIVE DIAGNOSES:  1. Intrauterine pregnancy at term.  2. Arrest of descent.  3. Occiput posterior position.   POSTOPERATIVE DIAGNOSES:  1. Intrauterine pregnancy at term.  2. Arrest of descent.  3. Occiput posterior position.   PROCEDURE:  Primary low transverse cesarean section.   SURGEON:  Dr. Marcelle Overlie.   ANESTHESIA:  Epidural.   SPECIMENS:  Female infant, Apgar's 8 at 1 minute, 9 at 5 minutes weighing 7  pounds 12 ounces.   ESTIMATED BLOOD LOSS:  400 mL.   COMPLICATIONS:  None.   DESCRIPTION OF PROCEDURE:  The patient taken to the operating room.  Her  epidural was dosed and found to be adequate.  She was prepped and draped in  the usual sterile fashion. A low transverse incision was made and carried  down to the fascia, the fascia scored in the midline and extended laterally.  The rectus muscles were separated in the midline, the peritoneum was entered  bluntly.  The peritoneal incision was then stretched.  The bladder blade was  inserted and the lower uterine segment was identified. The bladder flap was  created.  The bladder blade was adjusted.  A low transverse incision was  made in the uterus.  The uterus was entered using a hemostat.  The baby was  in OP position and was delivered quite easily after the head was gently  rotated and lifted out of the pelvis. The baby was a female infant, Apgar's  8 at 1 minute, 9 at 5 minutes and weighed 7 pounds 12 ounces.  The cord was  clamped and cut. The baby was handed to the waiting pediatrician. The cord  blood was obtained, the placenta was manually removed, noted  to be normal  and intact with a three-vessel cord.  The uterus was exteriorized and  cleared of all clots and debris.  The uterine incision was closed in one  layer using #0 chromic continuous running locked stitch.  The uterus was  returned to the abdomen.  Irrigation was performed. Hemostasis was noted.  The peritoneum was closed using #0 Vicryl continuous running stitch and  rectus muscles were reapproximated using 7-0 Vicryl.  The fascia was closed  using #0 Vicryl in continuous running stitch starting at each corner and  meeting  in the midline. The subcutaneous layer was closed with interrupteds using 2-  0 chromic. The skin was closed with staples.  All sponge, lap and instrument  counts were correct x2.  The patient went to the recovery room in stable  condition      Jill Horn, M.D.  Electronically Signed     MLG/MEDQ  D:  02/18/2006  T:  02/18/2006  Job:  [id number] 

## 2010-10-23 NOTE — Discharge Summary (Signed)
[name], Horn                  ACCOUNT NO.:  [id number]   MEDICAL RECORD NO.:  [id number]          PATIENT TYPE:  INP   LOCATION:  9140                          FACILITY:  [facility]   PHYSICIAN:  Duke Salvia. Marcelle Overlie, M.D.DATE OF BIRTH:  [birth year]   DATE OF ADMISSION:  02/17/2006  DATE OF DISCHARGE:  02/21/2006                                 DISCHARGE SUMMARY   ADMITTING DIAGNOSES:  1. Intrauterine pregnancy at 44 and three-sevenths weeks estimated      gestational age.  2. Spontaneous onset of labor with spontaneous rupture of membranes.   DISCHARGE DIAGNOSES:  1. Status post low transverse cesarean section secondary to arrest of      dilatation and occiput posterior.  2. Viable female infant.   PROCEDURE:  Primary low transverse cesarean section.   REASON FOR ADMISSION:  Please see written H&P.   HOSPITAL COURSE:  The patient is a 35 year old primigravida that presented  to [facility] at 38 and three-sevenths weeks with  spontaneous rupture of membranes.  Contractions were irregular, fetal heart  tones were reactive.  The patient had been seen in the office on week prior  to admission with large-for-gestational-age infant with estimated fetal  weight of 8 pounds 8 ounces.  On admission, cervix was dilated to 5-6 cm,  vertex, and a 0 station. The patient did progress to full dilatation after  pushing for approximately 1-and-a-half hours and head continued to be at a 0  station with caput at +1 station.  Occiput was noted to be posterior.  Fetal  heart tones were in the 180s with some decrease in short-term variability.  The patient was in a lot of discomfort despite epidural and decision was  made to proceed with a primary low transverse cesarean section for arrest of  descent.  The patient was then transferred to the operating room where  epidural was dosed to an adequate surgical level.  A low transverse incision  was made with the delivery of a viable female  infant weighing 7 pounds 12  ounces with Apgars of 9 at one minute and 9 at five minutes.  The patient  tolerated the procedure well and was then taken to the recovery room in  stable condition.  Later that morning the patient was then transferred to  the mother-baby-unit.  She was without complaint.  She had vomited a small  amount of emesis earlier that morning. Vital signs were stable, she was  afebrile.  Blood pressure was 130/79 to 129/77.  Abdomen was soft with some  decrease in bowel sounds.  Fundus was firm and nontender.  Abdominal  dressing was noted to have a small amount of drainage noted on the bandage.  Foley was to straight drainage with adequate amount of urine output.  Laboratory findings were pending.  On postoperative day #1, the patient  denied any headache, blurred vision or upper quadrant pain.  Vital signs  were stable with blood pressure 137 to 150 over 81 to 93.  Lungs were clear  to auscultation.  Abdomen was soft.  No epigastric  tenderness was noted.  Incision was clean, dry, and intact.  Deep tendon reflexes were 2+ without  clonus.  PIH labs were drawn which were within normal limits.  On  postoperative day #2, the patient continued to deny headache or blurred  vision or right upper quadrant pain.  Vital signs were stable with blood  pressures 141/94 to 131/82.  The patient was ambulating well, tolerating a  regular diet without complaints of nausea and vomiting.  On postoperative  day #3, the patient continued to deny headache, blurred vision, or right  upper quadrant pain.  Vital signs were stable, blood pressure 117/73 to  126/92.  Abdomen was soft, fundus firm and nontender.  Incision was clean,  dry, and intact.  Staples were removed and the patient was later discharged  home.   CONDITION ON DISCHARGE:  Stable.   DIET:  Regular as tolerated.   ACTIVITY:  No heavy lifting, no driving x2 weeks, no vaginal entry.   FOLLOWUP:  The patient is to follow up  in the office in 1 week for a blood  pressure check and incision check.  She is to call for temperature greater  than 100 degrees, persistent nausea and vomiting, heavy vaginal bleeding,  and/or redness or drainage from the incisional site.   DISCHARGE MEDICATIONS:  1. Percocet 5/325 #30 one p.o. every 4-6 hours p.r.n.  2. Motrin 600 mg every 6 hours.  3. Prenatal vitamins one p.o. daily.  4. Colace one p.o. daily p.r.n.      Julio Sicks, N.P.      Richard M. Marcelle Overlie, M.D.  Electronically Signed    CC/MEDQ  D:  03/21/2006  T:  03/21/2006  Job:  [id number]

## 2010-11-24 ENCOUNTER — Ambulatory Visit (INDEPENDENT_AMBULATORY_CARE_PROVIDER_SITE_OTHER): Payer: Managed Care, Other (non HMO) | Admitting: *Deleted

## 2010-11-24 DIAGNOSIS — Z0000 Encounter for general adult medical examination without abnormal findings: Secondary | ICD-10-CM

## 2011-03-08 LAB — POCT HEMOGLOBIN-HEMACUE: Hemoglobin: 14.5

## 2011-04-29 IMAGING — US US ABDOMEN COMPLETE
1 series · 14 of 25 positions shown · non-contrast
Comparison: None.

CLINICAL DATA: Elevated LFTs

ABDOMINAL ULTRASOUND COMPLETE

[Series 1: us abdomen complete · 0.37mm/px · 14 of 79 slices shown]
[im 1/79]
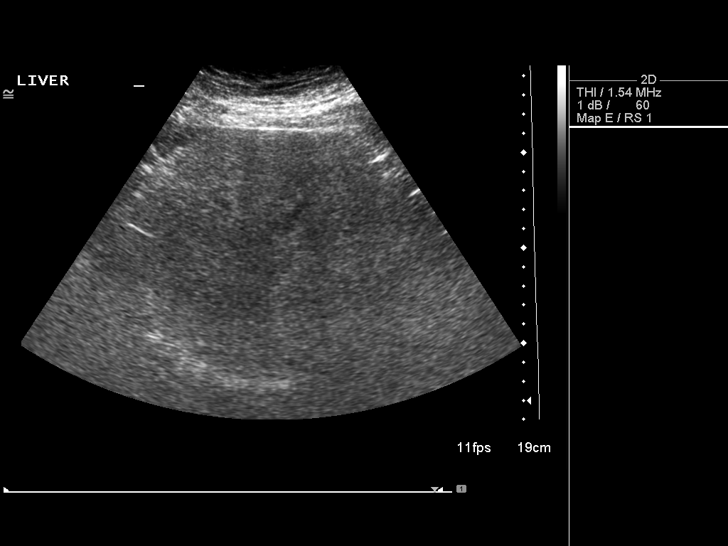
[im 7/79]
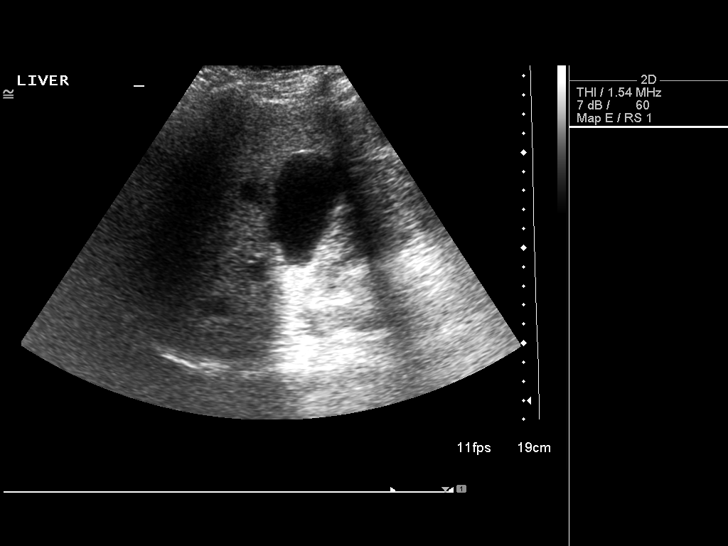
[im 14/79]
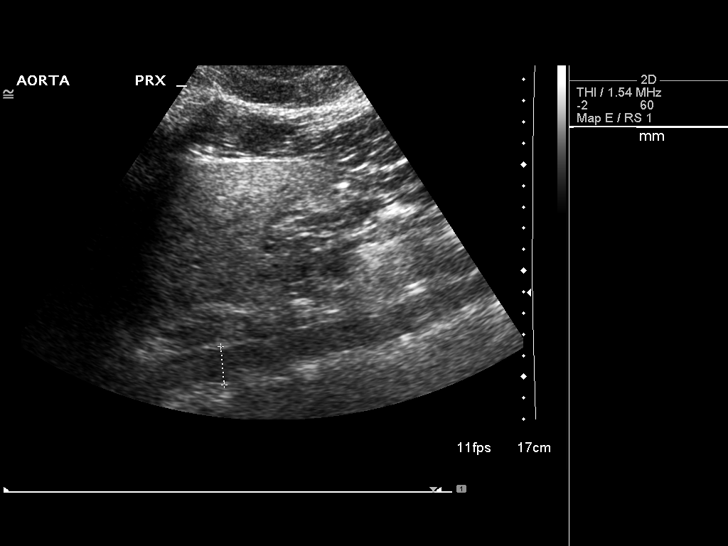
[im 20/79]
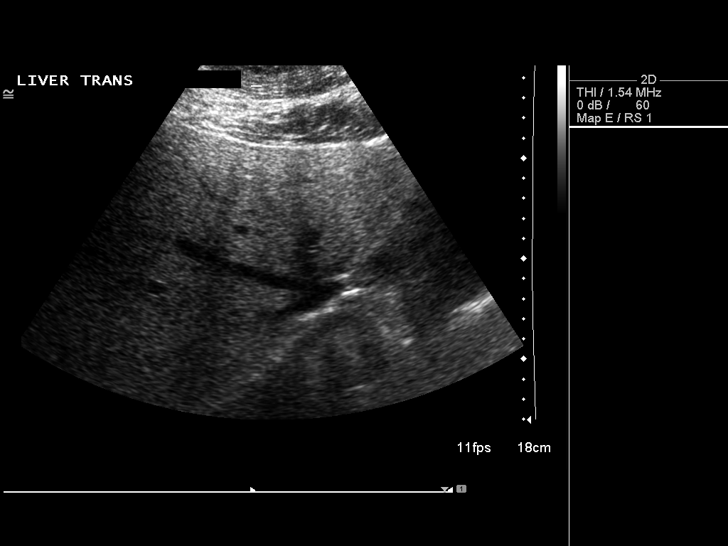
[im 27/79]
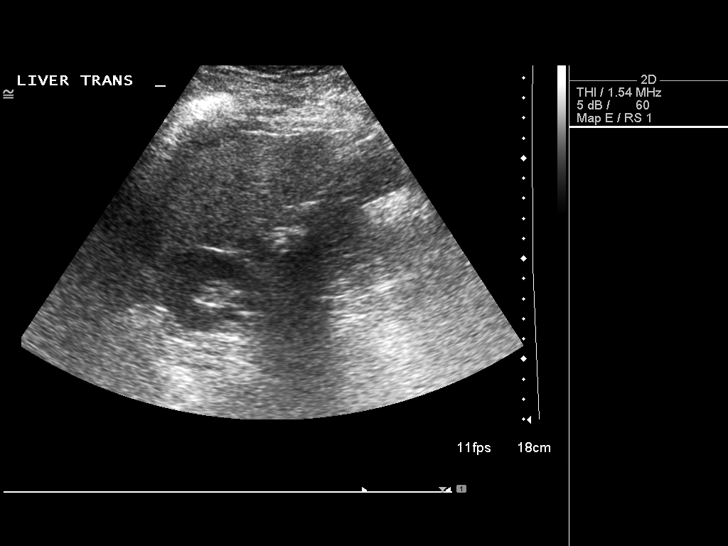
[im 30/79]
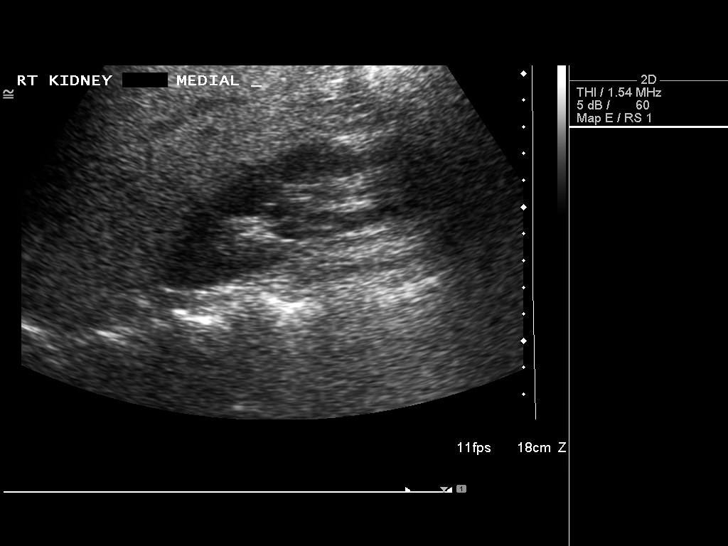
[im 36/79]
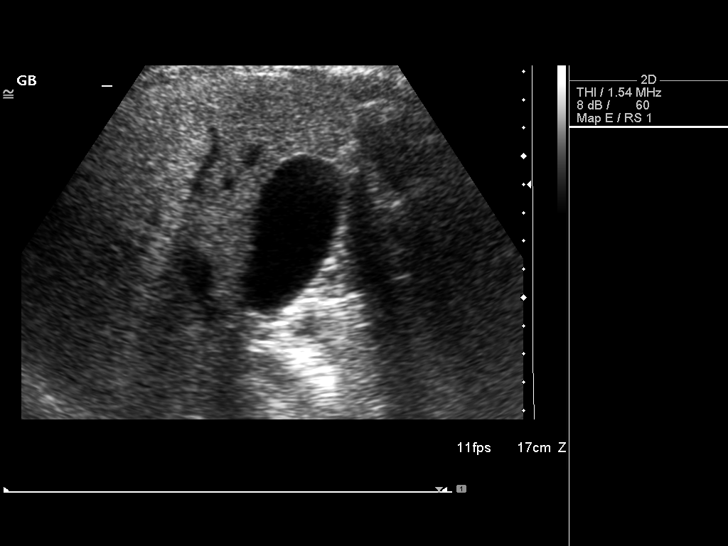
[im 43/79]
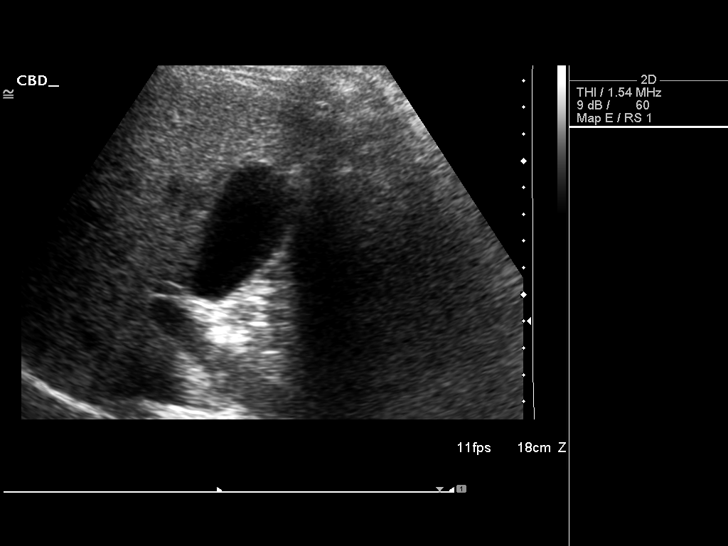
[im 49/79]
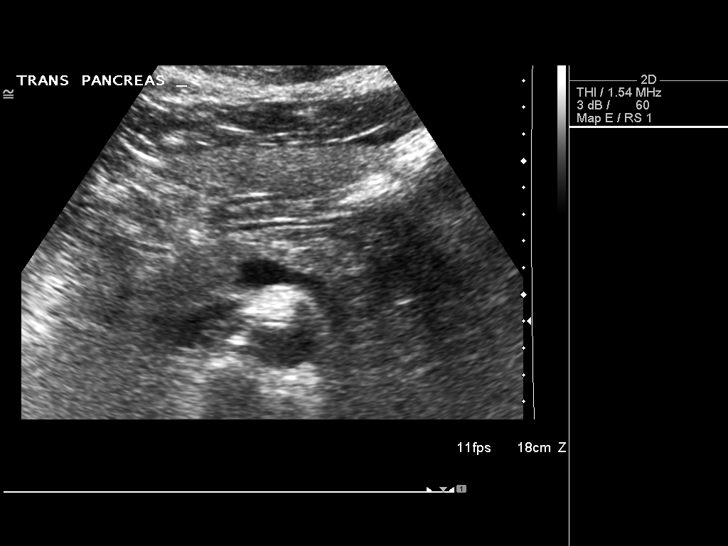
[im 53/79]
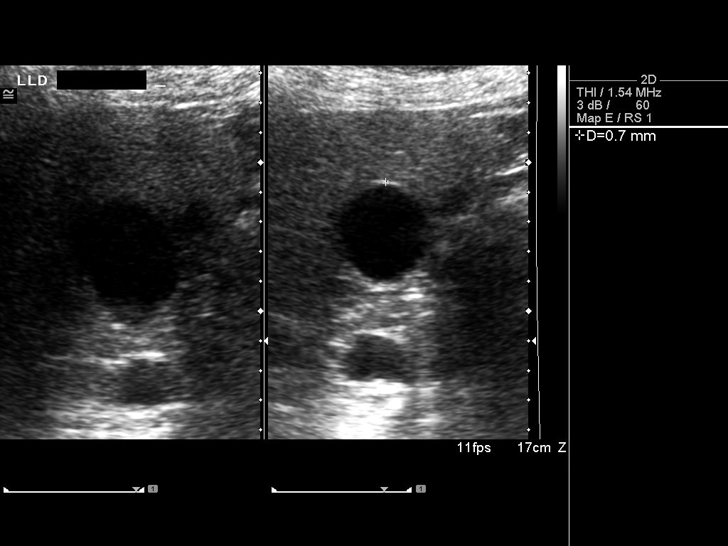
[im 59/79]
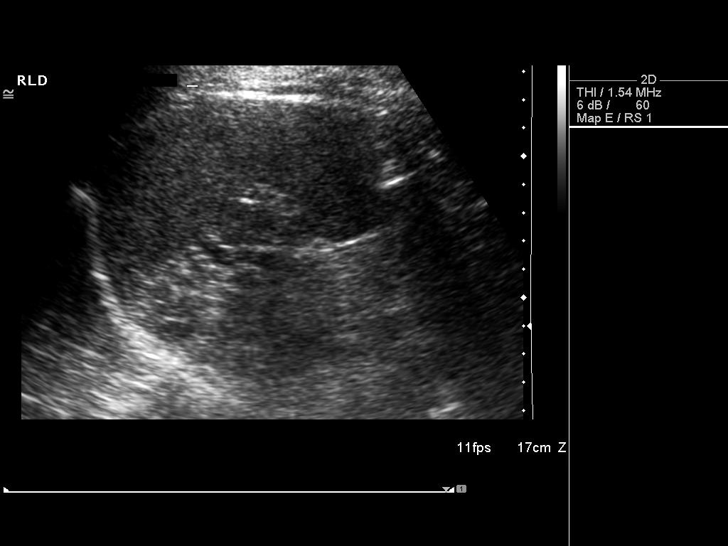
[im 66/79]
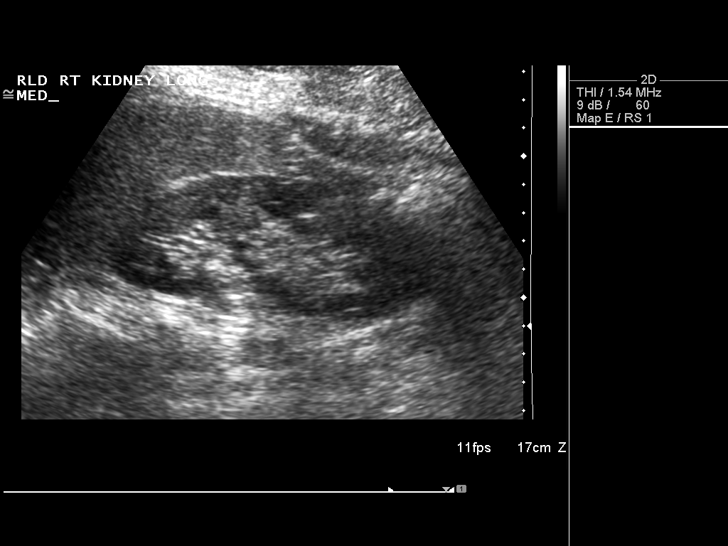
[im 72/79]
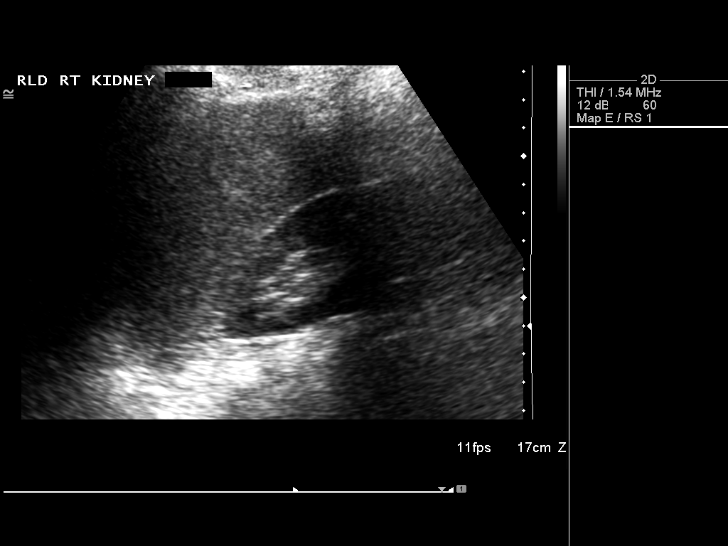
[im 79/79]
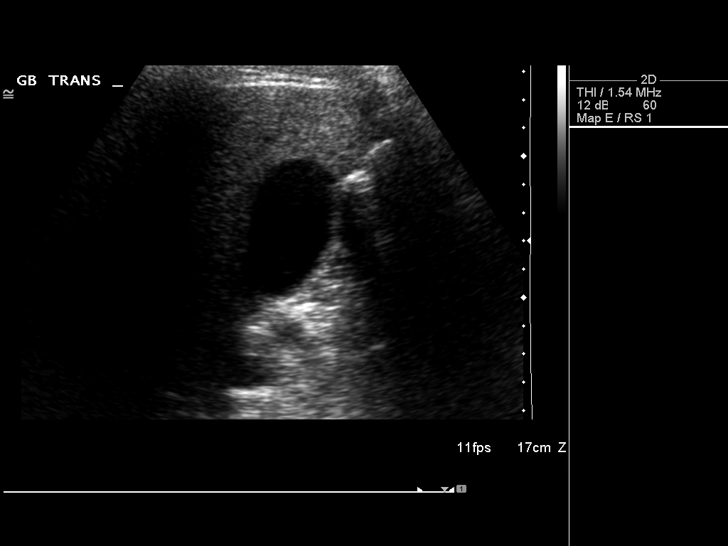

[14 of 25 positions shown; findings below may reference images not displayed]

FINDINGS: Gallbladder:  No gallstones, gallbladder wall thickening, or
pericholecystic fluid.

Common Bile Duct:  Within normal limits in caliber.

Liver: Diffuse increased echogenicity of the liver suspicious for
background hepatic steatosis.  Focal fatty sparing suspected along
the gallbladder.  No intrahepatic biliary dilatation or definite
focal hepatic abnormality.

IVC:  Appears normal.

Pancreas:  No abnormality identified.

Spleen:  Within normal limits in size and echotexture.

Right kidney:  Normal in size and parenchymal echogenicity.  No
evidence of mass or hydronephrosis.

Left kidney:  Normal in size and parenchymal echogenicity.  No
evidence of mass or hydronephrosis.

Abdominal Aorta:  No aneurysm identified.
IMPRESSION: No acute finding by ultrasound.
Hepatic steatosis

## 2011-06-21 ENCOUNTER — Telehealth: Payer: Self-pay | Admitting: Family Medicine

## 2011-06-21 NOTE — Telephone Encounter (Signed)
Dedra Skeens 06/21/2011 3:09 PM Signed  Patient states that she cannot remember when her 1st and 2nd shot of hep series was. She states that she still needs to get the third shot but is wondering if she took too long between the 1st and 2nd shot. Patient states that she will be going to [place] later on this year.  Last immunizations for Hep A/B: 04.02.12 & 06.19.12 Please advise.

## 2011-06-21 NOTE — Telephone Encounter (Signed)
Not too late to get 3rd one

## 2011-06-21 NOTE — Telephone Encounter (Signed)
Discussed with patient and apt scheduled     KP 

## 2011-06-21 NOTE — Telephone Encounter (Signed)
Patient states that she cannot remember when her 1st and 2nd shot of hep series was. She states that she still needs to get the third shot but is wondering if she took too long between the 1st and 2nd shot.  Patient states that she will be going to [place] later on this year.

## 2011-06-22 ENCOUNTER — Ambulatory Visit (INDEPENDENT_AMBULATORY_CARE_PROVIDER_SITE_OTHER): Payer: Managed Care, Other (non HMO) | Admitting: *Deleted

## 2011-06-22 VITALS — Temp 97.9°F

## 2011-06-22 DIAGNOSIS — Z23 Encounter for immunization: Secondary | ICD-10-CM

## 2011-06-22 DIAGNOSIS — Z0000 Encounter for general adult medical examination without abnormal findings: Secondary | ICD-10-CM

## 2011-06-23 ENCOUNTER — Ambulatory Visit: Payer: Managed Care, Other (non HMO)

## 2011-09-09 ENCOUNTER — Ambulatory Visit (INDEPENDENT_AMBULATORY_CARE_PROVIDER_SITE_OTHER): Payer: Managed Care, Other (non HMO) | Admitting: Family Medicine

## 2011-09-09 ENCOUNTER — Telehealth: Payer: Self-pay | Admitting: *Deleted

## 2011-09-09 ENCOUNTER — Encounter: Payer: Self-pay | Admitting: Family Medicine

## 2011-09-09 VITALS — BP 112/74 | HR 91 | Temp 98.5°F | Ht 66.0 in | Wt 205.2 lb

## 2011-09-09 DIAGNOSIS — Z0000 Encounter for general adult medical examination without abnormal findings: Secondary | ICD-10-CM

## 2011-09-09 LAB — CBC WITH DIFFERENTIAL/PLATELET
Basophils Relative: 0.3 % (ref 0.0–3.0)
Eosinophils Absolute: 0.2 10*3/uL (ref 0.0–0.7)
Eosinophils Relative: 2.4 % (ref 0.0–5.0)
Hemoglobin: 14.6 g/dL (ref 12.0–15.0)
Lymphocytes Relative: 33.6 % (ref 12.0–46.0)
MCHC: 33.5 g/dL (ref 30.0–36.0)
Monocytes Relative: 5.8 % (ref 3.0–12.0)
Neutro Abs: 3.6 10*3/uL (ref 1.4–7.7)
Neutrophils Relative %: 57.9 % (ref 43.0–77.0)
RBC: 4.81 Mil/uL (ref 3.87–5.11)
WBC: 6.3 10*3/uL (ref 4.5–10.5)

## 2011-09-09 LAB — HEPATIC FUNCTION PANEL
AST: 32 U/L (ref 0–37)
Alkaline Phosphatase: 65 U/L (ref 39–117)
Bilirubin, Direct: 0 mg/dL (ref 0.0–0.3)
Total Protein: 7.1 g/dL (ref 6.0–8.3)

## 2011-09-09 LAB — BASIC METABOLIC PANEL
Calcium: 8.8 mg/dL (ref 8.4–10.5)
Creatinine, Ser: 0.9 mg/dL (ref 0.4–1.2)
GFR: 76.86 mL/min (ref >60.00)
Sodium: 140 mEq/L (ref 135–145)

## 2011-09-09 LAB — POCT URINALYSIS DIPSTICK
Glucose, UA: NEGATIVE
Ketones, UA: NEGATIVE
Leukocytes, UA: NEGATIVE
Protein, UA: NEGATIVE
Urobilinogen, UA: 0.2

## 2011-09-09 LAB — TSH: TSH: 0.77 u[IU]/mL (ref 0.35–5.50)

## 2011-09-09 LAB — LIPID PANEL: HDL: 42.2 mg/dL (ref >39.00)

## 2011-09-09 LAB — LDL CHOLESTEROL, DIRECT: Direct LDL: 150.3 mg/dL

## 2011-09-09 MED ORDER — CHLOROQUINE PHOSPHATE 250 MG PO TABS: ORAL_TABLET | ORAL | Status: DC

## 2011-09-09 MED ORDER — CHLOROQUINE PHOSPHATE 500 MG PO TABS: 500.0000 mg | ORAL_TABLET | Freq: Every day | ORAL | Status: AC

## 2011-09-09 NOTE — Telephone Encounter (Signed)
I sent it to pharmacy when she was here

## 2011-09-09 NOTE — Telephone Encounter (Signed)
Pharmacy rep called to advise pt had new rx sent today for chloriquine 250mg , rep stated the CDC recommends 500mg  1 week 1-2 weeks prior to traveling and 4 weeks after, please advise, [facility] [phone] rep Janeice Robinson

## 2011-09-09 NOTE — Patient Instructions (Signed)
Preventive Care for Adults, Female A healthy lifestyle and preventive care can promote health and wellness. Preventive health guidelines for women include the following key practices.  A routine yearly physical is a good way to check with your caregiver about your health and preventive screening. It is a chance to share any concerns and updates on your health, and to receive a thorough exam.   Visit your dentist for a routine exam and preventive care every 6 months. Brush your teeth twice a day and floss once a day. Good oral hygiene prevents tooth decay and gum disease.   The frequency of eye exams is based on your age, health, family medical history, use of contact lenses, and other factors. Follow your caregiver's recommendations for frequency of eye exams.   Eat a healthy diet. Foods like vegetables, fruits, whole grains, low-fat dairy products, and lean protein foods contain the nutrients you need without too many calories. Decrease your intake of foods high in solid fats, added sugars, and salt. Eat the right amount of calories for you.Get information about a proper diet from your caregiver, if necessary.   Regular physical exercise is one of the most important things you can do for your health. Most adults should get at least 150 minutes of moderate-intensity exercise (any activity that increases your heart rate and causes you to sweat) each week. In addition, most adults need muscle-strengthening exercises on 2 or more days a week.   Maintain a healthy weight. The body mass index (BMI) is a screening tool to identify possible weight problems. It provides an estimate of body fat based on height and weight. Your caregiver can help determine your BMI, and can help you achieve or maintain a healthy weight.For adults 20 years and older:   A BMI below 18.5 is considered underweight.   A BMI of 18.5 to 24.9 is normal.   A BMI of 25 to 29.9 is considered overweight.   A BMI of 30 and above is  considered obese.   Maintain normal blood lipids and cholesterol levels by exercising and minimizing your intake of saturated fat. Eat a balanced diet with plenty of fruit and vegetables. Blood tests for lipids and cholesterol should begin at age 20 and be repeated every 5 years. If your lipid or cholesterol levels are high, you are over 50, or you are at high risk for heart disease, you may need your cholesterol levels checked more frequently.Ongoing high lipid and cholesterol levels should be treated with medicines if diet and exercise are not effective.   If you smoke, find out from your caregiver how to quit. If you do not use tobacco, do not start.   If you are pregnant, do not drink alcohol. If you are breastfeeding, be very cautious about drinking alcohol. If you are not pregnant and choose to drink alcohol, do not exceed 1 drink per day. One drink is considered to be 12 ounces (355 mL) of beer, 5 ounces (148 mL) of wine, or 1.5 ounces (44 mL) of liquor.   Avoid use of street drugs. Do not share needles with anyone. Ask for help if you need support or instructions about stopping the use of drugs.   High blood pressure causes heart disease and increases the risk of stroke. Your blood pressure should be checked at least every 1 to 2 years. Ongoing high blood pressure should be treated with medicines if weight loss and exercise are not effective.   If you are 55 to 37   years old, ask your caregiver if you should take aspirin to prevent strokes.   Diabetes screening involves taking a blood sample to check your fasting blood sugar level. This should be done once every 3 years, after age 45, if you are within normal weight and without risk factors for diabetes. Testing should be considered at a younger age or be carried out more frequently if you are overweight and have at least 1 risk factor for diabetes.   Breast cancer screening is essential preventive care for women. You should practice "breast  self-awareness." This means understanding the normal appearance and feel of your breasts and may include breast self-examination. Any changes detected, no matter how small, should be reported to a caregiver. Women in their 20s and 30s should have a clinical breast exam (CBE) by a caregiver as part of a regular health exam every 1 to 3 years. After age 40, women should have a CBE every year. Starting at age 40, women should consider having a mammography (breast X-ray test) every year. Women who have a family history of breast cancer should talk to their caregiver about genetic screening. Women at a high risk of breast cancer should talk to their caregivers about having magnetic resonance imaging (MRI) and a mammography every year.   The Pap test is a screening test for cervical cancer. A Pap test can show cell changes on the cervix that might become cervical cancer if left untreated. A Pap test is a procedure in which cells are obtained and examined from the lower end of the uterus (cervix).   Women should have a Pap test starting at age 21.   Between ages 21 and 29, Pap tests should be repeated every 2 years.   Beginning at age 30, you should have a Pap test every 3 years as long as the past 3 Pap tests have been normal.   Some women have medical problems that increase the chance of getting cervical cancer. Talk to your caregiver about these problems. It is especially important to talk to your caregiver if a new problem develops soon after your last Pap test. In these cases, your caregiver may recommend more frequent screening and Pap tests.   The above recommendations are the same for women who have or have not gotten the vaccine for human papillomavirus (HPV).   If you had a hysterectomy for a problem that was not cancer or a condition that could lead to cancer, then you no longer need Pap tests. Even if you no longer need a Pap test, a regular exam is a good idea to make sure no other problems are  starting.   If you are between ages 65 and 70, and you have had normal Pap tests going back 10 years, you no longer need Pap tests. Even if you no longer need a Pap test, a regular exam is a good idea to make sure no other problems are starting.   If you have had past treatment for cervical cancer or a condition that could lead to cancer, you need Pap tests and screening for cancer for at least 20 years after your treatment.   If Pap tests have been discontinued, risk factors (such as a new sexual partner) need to be reassessed to determine if screening should be resumed.   The HPV test is an additional test that may be used for cervical cancer screening. The HPV test looks for the virus that can cause the cell changes on the cervix.   The cells collected during the Pap test can be tested for HPV. The HPV test could be used to screen women aged 30 years and older, and should be used in women of any age who have unclear Pap test results. After the age of 30, women should have HPV testing at the same frequency as a Pap test.   Colorectal cancer can be detected and often prevented. Most routine colorectal cancer screening begins at the age of 50 and continues through age 75. However, your caregiver may recommend screening at an earlier age if you have risk factors for colon cancer. On a yearly basis, your caregiver may provide home test kits to check for hidden blood in the stool. Use of a small camera at the end of a tube, to directly examine the colon (sigmoidoscopy or colonoscopy), can detect the earliest forms of colorectal cancer. Talk to your caregiver about this at age 50, when routine screening begins. Direct examination of the colon should be repeated every 5 to 10 years through age 75, unless early forms of pre-cancerous polyps or small growths are found.   Hepatitis C blood testing is recommended for all people born from 1945 through 1965 and any individual with known risks for hepatitis C.    Practice safe sex. Use condoms and avoid high-risk sexual practices to reduce the spread of sexually transmitted infections (STIs). STIs include gonorrhea, chlamydia, syphilis, trichomonas, herpes, HPV, and human immunodeficiency virus (HIV). Herpes, HIV, and HPV are viral illnesses that have no cure. They can result in disability, cancer, and death. Sexually active women aged 25 and younger should be checked for chlamydia. Older women with new or multiple partners should also be tested for chlamydia. Testing for other STIs is recommended if you are sexually active and at increased risk.   Osteoporosis is a disease in which the bones lose minerals and strength with aging. This can result in serious bone fractures. The risk of osteoporosis can be identified using a bone density scan. Women ages 65 and over and women at risk for fractures or osteoporosis should discuss screening with their caregivers. Ask your caregiver whether you should take a calcium supplement or vitamin D to reduce the rate of osteoporosis.   Menopause can be associated with physical symptoms and risks. Hormone replacement therapy is available to decrease symptoms and risks. You should talk to your caregiver about whether hormone replacement therapy is right for you.   Use sunscreen with sun protection factor (SPF) of 30 or more. Apply sunscreen liberally and repeatedly throughout the day. You should seek shade when your shadow is shorter than you. Protect yourself by wearing long sleeves, pants, a wide-brimmed hat, and sunglasses year round, whenever you are outdoors.   Once a month, do a whole body skin exam, using a mirror to look at the skin on your back. Notify your caregiver of new moles, moles that have irregular borders, moles that are larger than a pencil eraser, or moles that have changed in shape or color.   Stay current with required immunizations.   Influenza. You need a dose every fall (or winter). The composition of  the flu vaccine changes each year, so being vaccinated once is not enough.   Pneumococcal polysaccharide. You need 1 to 2 doses if you smoke cigarettes or if you have certain chronic medical conditions. You need 1 dose at age 65 (or older) if you have never been vaccinated.   Tetanus, diphtheria, pertussis (Tdap, Td). Get 1 dose of   Tdap vaccine if you are younger than age 65, are over 65 and have contact with an infant, are a healthcare worker, are pregnant, or simply want to be protected from whooping cough. After that, you need a Td booster dose every 10 years. Consult your caregiver if you have not had at least 3 tetanus and diphtheria-containing shots sometime in your life or have a deep or dirty wound.   HPV. You need this vaccine if you are a woman age 26 or younger. The vaccine is given in 3 doses over 6 months.   Measles, mumps, rubella (MMR). You need at least 1 dose of MMR if you were born in 1957 or later. You may also need a second dose.   Meningococcal. If you are age 19 to 21 and a first-year college student living in a residence hall, or have one of several medical conditions, you need to get vaccinated against meningococcal disease. You may also need additional booster doses.   Zoster (shingles). If you are age 60 or older, you should get this vaccine.   Varicella (chickenpox). If you have never had chickenpox or you were vaccinated but received only 1 dose, talk to your caregiver to find out if you need this vaccine.   Hepatitis A. You need this vaccine if you have a specific risk factor for hepatitis A virus infection or you simply wish to be protected from this disease. The vaccine is usually given as 2 doses, 6 to 18 months apart.   Hepatitis B. You need this vaccine if you have a specific risk factor for hepatitis B virus infection or you simply wish to be protected from this disease. The vaccine is given in 3 doses, usually over 6 months.  Preventive Services /  Frequency Ages 19 to 39  Blood pressure check.** / Every 1 to 2 years.   Lipid and cholesterol check.** / Every 5 years beginning at age 20.   Clinical breast exam.** / Every 3 years for women in their 20s and 30s.   Pap test.** / Every 2 years from ages 21 through 29. Every 3 years starting at age 30 through age 65 or 70 with a history of 3 consecutive normal Pap tests.   HPV screening.** / Every 3 years from ages 30 through ages 65 to 70 with a history of 3 consecutive normal Pap tests.   Hepatitis C blood test.** / For any individual with known risks for hepatitis C.   Skin self-exam. / Monthly.   Influenza immunization.** / Every year.   Pneumococcal polysaccharide immunization.** / 1 to 2 doses if you smoke cigarettes or if you have certain chronic medical conditions.   Tetanus, diphtheria, pertussis (Tdap, Td) immunization. / A one-time dose of Tdap vaccine. After that, you need a Td booster dose every 10 years.   HPV immunization. / 3 doses over 6 months, if you are 26 and younger.   Measles, mumps, rubella (MMR) immunization. / You need at least 1 dose of MMR if you were born in 1957 or later. You may also need a second dose.   Meningococcal immunization. / 1 dose if you are age 19 to 21 and a first-year college student living in a residence hall, or have one of several medical conditions, you need to get vaccinated against meningococcal disease. You may also need additional booster doses.   Varicella immunization.** / Consult your caregiver.   Hepatitis A immunization.** / Consult your caregiver. 2 doses, 6 to 18 months   apart.   Hepatitis B immunization.** / Consult your caregiver. 3 doses usually over 6 months.  Ages 40 to 64  Blood pressure check.** / Every 1 to 2 years.   Lipid and cholesterol check.** / Every 5 years beginning at age 20.   Clinical breast exam.** / Every year after age 40.   Mammogram.** / Every year beginning at age 40 and continuing for as  long as you are in good health. Consult with your caregiver.   Pap test.** / Every 3 years starting at age 30 through age 65 or 70 with a history of 3 consecutive normal Pap tests.   HPV screening.** / Every 3 years from ages 30 through ages 65 to 70 with a history of 3 consecutive normal Pap tests.   Fecal occult blood test (FOBT) of stool. / Every year beginning at age 50 and continuing until age 75. You may not need to do this test if you get a colonoscopy every 10 years.   Flexible sigmoidoscopy or colonoscopy.** / Every 5 years for a flexible sigmoidoscopy or every 10 years for a colonoscopy beginning at age 50 and continuing until age 75.   Hepatitis C blood test.** / For all people born from 1945 through 1965 and any individual with known risks for hepatitis C.   Skin self-exam. / Monthly.   Influenza immunization.** / Every year.   Pneumococcal polysaccharide immunization.** / 1 to 2 doses if you smoke cigarettes or if you have certain chronic medical conditions.   Tetanus, diphtheria, pertussis (Tdap, Td) immunization.** / A one-time dose of Tdap vaccine. After that, you need a Td booster dose every 10 years.   Measles, mumps, rubella (MMR) immunization. / You need at least 1 dose of MMR if you were born in 1957 or later. You may also need a second dose.   Varicella immunization.** / Consult your caregiver.   Meningococcal immunization.** / Consult your caregiver.   Hepatitis A immunization.** / Consult your caregiver. 2 doses, 6 to 18 months apart.   Hepatitis B immunization.** / Consult your caregiver. 3 doses, usually over 6 months.  Ages 65 and over  Blood pressure check.** / Every 1 to 2 years.   Lipid and cholesterol check.** / Every 5 years beginning at age 20.   Clinical breast exam.** / Every year after age 40.   Mammogram.** / Every year beginning at age 40 and continuing for as long as you are in good health. Consult with your caregiver.   Pap test.** /  Every 3 years starting at age 30 through age 65 or 70 with a 3 consecutive normal Pap tests. Testing can be stopped between 65 and 70 with 3 consecutive normal Pap tests and no abnormal Pap or HPV tests in the past 10 years.   HPV screening.** / Every 3 years from ages 30 through ages 65 or 70 with a history of 3 consecutive normal Pap tests. Testing can be stopped between 65 and 70 with 3 consecutive normal Pap tests and no abnormal Pap or HPV tests in the past 10 years.   Fecal occult blood test (FOBT) of stool. / Every year beginning at age 50 and continuing until age 75. You may not need to do this test if you get a colonoscopy every 10 years.   Flexible sigmoidoscopy or colonoscopy.** / Every 5 years for a flexible sigmoidoscopy or every 10 years for a colonoscopy beginning at age 50 and continuing until age 75.   Hepatitis   C blood test.** / For all people born from 1945 through 1965 and any individual with known risks for hepatitis C.   Osteoporosis screening.** / A one-time screening for women ages 65 and over and women at risk for fractures or osteoporosis.   Skin self-exam. / Monthly.   Influenza immunization.** / Every year.   Pneumococcal polysaccharide immunization.** / 1 dose at age 65 (or older) if you have never been vaccinated.   Tetanus, diphtheria, pertussis (Tdap, Td) immunization. / A one-time dose of Tdap vaccine if you are over 65 and have contact with an infant, are a healthcare worker, or simply want to be protected from whooping cough. After that, you need a Td booster dose every 10 years.   Varicella immunization.** / Consult your caregiver.   Meningococcal immunization.** / Consult your caregiver.   Hepatitis A immunization.** / Consult your caregiver. 2 doses, 6 to 18 months apart.   Hepatitis B immunization.** / Check with your caregiver. 3 doses, usually over 6 months.  ** Family history and personal history of risk and conditions may change your caregiver's  recommendations. Document Released: 07/20/2001 Document Revised: 05/13/2011 Document Reviewed: 10/19/2010 [employer] Patient Information 2012 [employer]. 

## 2011-09-09 NOTE — Telephone Encounter (Signed)
Rx sent with the new dose and direction.     KP

## 2011-09-09 NOTE — Telephone Encounter (Signed)
Please advise      KP 

## 2011-09-09 NOTE — Telephone Encounter (Signed)
Addended by: Arnette Norris on: 09/09/2011 03:30 PM   Modules accepted: Orders

## 2011-09-09 NOTE — Progress Notes (Signed)
  Subjective:     Jill Horn is a 36 y.o. female and is here for a comprehensive physical exam. The patient reports no problems.  History   Social History  . Marital Status: Married    Spouse Name: N/A    Number of Children: N/A  . Years of Education: N/A   Occupational History  . Pharm Rep    Social History Main Topics  . Smoking status: Never Smoker   . Smokeless tobacco: Never Used  . Alcohol Use: No  . Drug Use: No  . Sexually Active: Yes    Birth Control/ Protection: None   Other Topics Concern  . Not on file   Social History Narrative  . No narrative on file   Health Maintenance  Topic Date Due  . Pap Smear  09/16/1992  . Influenza Vaccine  03/07/2012  . Tetanus/tdap  09/06/2020    The following portions of the patient's history were reviewed and updated as appropriate: allergies, current medications, past family history, past medical history, past social history, past surgical history and problem list.  Review of Systems Review of Systems  Constitutional: Negative for activity change, appetite change and fatigue.  HENT: Negative for hearing loss, congestion, tinnitus and ear discharge.  dentist q64m Eyes: Negative for visual disturbance (see optho q1y -- vision corrected to 20/20 with glasses).  Respiratory: Negative for cough, chest tightness and shortness of breath.   Cardiovascular: Negative for chest pain, palpitations and leg swelling.  Gastrointestinal: Negative for abdominal pain, diarrhea, constipation and abdominal distention.  Genitourinary: Negative for urgency, frequency, decreased urine volume and difficulty urinating.  Musculoskeletal: Negative for back pain, arthralgias and gait problem.  Skin: Negative for color change, pallor and rash.  Neurological: Negative for dizziness, light-headedness, numbness and headaches.  Hematological: Negative for adenopathy. Does not bruise/bleed easily.  Psychiatric/Behavioral: Negative for suicidal  ideas, confusion, sleep disturbance, self-injury, dysphoric mood, decreased concentration and agitation.       Objective:    BP 112/74  Pulse 91  Temp(Src) 98.5 F (36.9 C) (Oral)  Ht 5\' 6"  (1.676 m)  Wt 205 lb 3.2 oz (93.078 kg)  BMI 33.12 kg/m2  SpO2 98% General appearance: alert, cooperative, appears stated age and no distress Head: Normocephalic, without obvious abnormality, atraumatic Eyes: conjunctivae/corneas clear. PERRL, EOM's intact. Fundi benign. Ears: normal TM's and external ear canals both ears Nose: Nares normal. Septum midline. Mucosa normal. No drainage or sinus tenderness. Throat: lips, mucosa, and tongue normal; teeth and gums normal Neck: no adenopathy, supple, symmetrical, trachea midline and thyroid not enlarged, symmetric, no tenderness/mass/nodules Back: symmetric, no curvature. ROM normal. No CVA tenderness. Lungs: clear to auscultation bilaterally Breasts: normal appearance, no masses or tenderness Heart: regular rate and rhythm, S1, S2 normal, no murmur, click, rub or gallop Abdomen: soft, non-tender; bowel sounds normal; no masses,  no organomegaly Pelvic: gyn Extremities: extremities normal, atraumatic, no cyanosis or edema Pulses: 2+ and symmetric Skin: Skin color, texture, turgor normal. No rashes or lesions Lymph nodes: Cervical, supraclavicular, and axillary nodes normal. Neurologic: Alert and oriented X 3, normal strength and tone. Normal symmetric reflexes. Normal coordination and gait psych---- no depression, no anxiety    Assessment:    Healthy female exam.       Plan:  Check labs ghm utd   See After Visit Summary for Counseling Recommendations

## 2011-09-21 ENCOUNTER — Encounter: Payer: Self-pay | Admitting: *Deleted

## 2012-06-08 ENCOUNTER — Telehealth: Payer: Self-pay | Admitting: Family Medicine

## 2012-06-08 NOTE — Telephone Encounter (Signed)
Patient Information:  Caller Name: Jill Horn  Phone: [phone]  Patient: Jill Horn  Gender: Female  DOB: 1976-04-01  Age: 37 Years  PCP: Lelon Perla.  Pregnant: No  Office Follow Up:  Does the office need to follow up with this patient?: Yes  Instructions For The Office: Can patient be worked in  today please?  RN Note:  Needs to be seen today.  Can she be worsked in please?  I did go ahead and schedule her for the am with Dr. Laury Axon.  She prefers to see her only.  Symptoms  Reason For Call & Symptoms: Sinus pressure/pain, ear ache and sore throat  Reviewed Health History In EMR: Yes  Reviewed Medications In EMR: Yes  Reviewed Allergies In EMR: Yes  Reviewed Surgeries / Procedures: Yes  Date of Onset of Symptoms: 05/25/2012  Treatments Tried: Tylenol  Treatments Tried Worked: No OB / GYN:  LMP: 04/07/2012  Guideline(s) Used:  Earache  Disposition Per Guideline:   See Today in Office  Reason For Disposition Reached:   All other earaches (Exceptions: earache lasting < 1 hour, and earache from air travel)  Advice Given:  N/A  Appointment Scheduled:  06/09/2012 11:00:00 Appointment Scheduled Provider:  Lelon Perla.

## 2012-06-08 NOTE — Telephone Encounter (Signed)
Let her know I have to leave early or I would see her today.  Is am ok?

## 2012-06-08 NOTE — Telephone Encounter (Signed)
Appointment Scheduled:  06/09/2012 11:00:00  Appointment Scheduled Provider:  Lelon Perla

## 2012-06-09 ENCOUNTER — Telehealth: Payer: Self-pay

## 2012-06-09 ENCOUNTER — Ambulatory Visit (INDEPENDENT_AMBULATORY_CARE_PROVIDER_SITE_OTHER): Payer: Managed Care, Other (non HMO) | Admitting: Family Medicine

## 2012-06-09 ENCOUNTER — Encounter: Payer: Self-pay | Admitting: Family Medicine

## 2012-06-09 VITALS — BP 130/76 | HR 97 | Temp 98.8°F | Wt 209.4 lb

## 2012-06-09 DIAGNOSIS — J029 Acute pharyngitis, unspecified: Secondary | ICD-10-CM

## 2012-06-09 DIAGNOSIS — N912 Amenorrhea, unspecified: Secondary | ICD-10-CM

## 2012-06-09 DIAGNOSIS — J069 Acute upper respiratory infection, unspecified: Secondary | ICD-10-CM

## 2012-06-09 LAB — CBC WITH DIFFERENTIAL/PLATELET
Eosinophils Relative: 2.3 % (ref 0.0–5.0)
Lymphocytes Relative: 32 % (ref 12.0–46.0)
Monocytes Relative: 6.1 % (ref 3.0–12.0)
Neutrophils Relative %: 59.3 % (ref 43.0–77.0)
Platelets: 186 10*3/uL (ref 150.0–400.0)
WBC: 7.3 10*3/uL (ref 4.5–10.5)

## 2012-06-09 LAB — HEPATIC FUNCTION PANEL
ALT: 72 U/L — ABNORMAL HIGH (ref 0–35)
AST: 45 U/L — ABNORMAL HIGH (ref 0–37)
Alkaline Phosphatase: 69 U/L (ref 39–117)
Bilirubin, Direct: 0 mg/dL (ref 0.0–0.3)
Total Bilirubin: 0.9 mg/dL (ref 0.3–1.2)

## 2012-06-09 LAB — BASIC METABOLIC PANEL
BUN: 14 mg/dL (ref 6–23)
Creatinine, Ser: 0.7 mg/dL (ref 0.4–1.2)
GFR: 96.49 mL/min (ref >60.00)

## 2012-06-09 LAB — HCG, QUANTITATIVE, PREGNANCY: hCG, Beta Chain, Quant, S: 0.05 m[IU]/mL

## 2012-06-09 MED ORDER — FLUTICASONE PROPIONATE 50 MCG/ACT NA SUSP: 2.0000 | Freq: Every day | NASAL | Status: DC

## 2012-06-09 MED ORDER — CLARITHROMYCIN ER 500 MG PO TB24: 1000.0000 mg | ORAL_TABLET | Freq: Every day | ORAL | Status: AC

## 2012-06-09 MED ORDER — LORATADINE 10 MG PO TABS: 10.0000 mg | ORAL_TABLET | Freq: Every day | ORAL | Status: DC

## 2012-06-09 MED ORDER — ERYTHROMYCIN BASE 500 MG PO TABS: 500.0000 mg | ORAL_TABLET | Freq: Two times a day (BID) | ORAL | Status: DC

## 2012-06-09 NOTE — Patient Instructions (Signed)

## 2012-06-09 NOTE — Telephone Encounter (Signed)
Patient has been made aware. Rx faxed     KP 

## 2012-06-09 NOTE — Telephone Encounter (Signed)
Call from the patient and she stated the Biaxin was $135 and she wanted to get something that was cheaper sent to the [facility] in [facility] Please advise      KP

## 2012-06-09 NOTE — Telephone Encounter (Signed)
Erythromycin 500 mg 1 po bid for 10 days

## 2012-06-09 NOTE — Progress Notes (Signed)
  Subjective:     Jill Horn is a 37 y.o. female who presents for evaluation of sore throat. Associated symptoms include post nasal drip, sore throat, swollen glands and ear pain. Onset of symptoms was 1 month ago, and have been gradually worsening since that time. She is drinking plenty of fluids. She has not had a recent close exposure to someone with proven streptococcal pharyngitis but her kids have been sick and she is a pharm rep.   The following portions of the patient's history were reviewed and updated as appropriate:  She  has a past medical history of Allergy and Hyperlipidemia. She  does not have any pertinent problems on file. She  has past surgical history that includes Cesarean section and Carpal tunnel release (07/2006). Her family history includes Hyperlipidemia in an unspecified family member; Hypertension in an unspecified family member; and Lung cancer in her paternal grandfather. She  reports that she has never smoked. She has never used smokeless tobacco. She reports that she does not drink alcohol or use illicit drugs. She has a current medication list which includes the following prescription(s): albuterol, clarithromycin, fluticasone, fluticasone, and loratadine. Current Outpatient Prescriptions on File Prior to Visit  Medication Sig Dispense Refill  . albuterol (PROAIR HFA) 108 (90 BASE) MCG/ACT inhaler Inhale 2 puffs into the lungs 4 (four) times daily as needed.        . fluticasone (FLONASE) 50 MCG/ACT nasal spray 2 sprays by Nasal route daily.  16 g  3  . loratadine (CLARITIN) 10 MG tablet Take 1 tablet (10 mg total) by mouth daily.  30 tablet  11   She is allergic to amoxicillin and prednisone..  Review of Systems Pertinent items are noted in HPI.    Objective:    BP 130/76  Pulse 97  Temp 98.8 F (37.1 C) (Oral)  Wt 209 lb 6.4 oz (94.983 kg)  SpO2 97% General appearance: alert, cooperative, appears stated age and no distress Ears: + fluid  b/l Nose: Nares normal. Septum midline. Mucosa normal. No drainage or sinus tenderness. Throat: abnormal findings: mild oropharyngeal erythema Neck: moderate anterior cervical adenopathy, supple, symmetrical, trachea midline and thyroid not enlarged, symmetric, no tenderness/mass/nodules Lungs: clear to auscultation bilaterally Heart: S1, S2 normal  Laboratory Strep test not done. Results:--.    Assessment:    Acute pharyngitis with serous otitis    Plan:    Patient placed on antibiotics. Use of OTC analgesics recommended as well as salt water gargles. Follow up as needed. flonase and antihistamine  Check labs  rto prn

## 2012-06-12 LAB — EPSTEIN-BARR VIRUS VCA ANTIBODY PANEL: EBV VCA IgM: <10 U/mL (ref <36.0)

## 2012-06-14 ENCOUNTER — Telehealth: Payer: Self-pay | Admitting: *Deleted

## 2012-06-14 NOTE — Telephone Encounter (Signed)
Please advise      KP 

## 2012-06-14 NOTE — Telephone Encounter (Signed)
avelox 400 mg 1 po qd for 10 days

## 2012-06-14 NOTE — Telephone Encounter (Signed)
Patient called to let us know that over the weekend she was feeling better but is now having a sore throat again and is very fatigued. Patient feels like the Erthromycin is no longer working and wants something stronger. Advised patient that MD out of office this afternoon and she is fine for waiting on your opinion.  Please advise.

## 2012-06-15 MED ORDER — MOXIFLOXACIN HCL 400 MG PO TABS: 400.0000 mg | ORAL_TABLET | Freq: Every day | ORAL | Status: DC

## 2012-06-15 NOTE — Telephone Encounter (Signed)
Patient is aware and will have hep repeated when she is feeling better.      KP

## 2013-01-05 LAB — HM PAP SMEAR

## 2013-05-25 ENCOUNTER — Telehealth: Payer: Self-pay | Admitting: Family Medicine

## 2013-05-25 MED ORDER — NAPROXEN SODIUM 220 MG PO TABS: 220.0000 mg | ORAL_TABLET | Freq: Two times a day (BID) | ORAL | Status: DC

## 2013-05-25 MED ORDER — LORATADINE 10 MG PO TABS: 10.0000 mg | ORAL_TABLET | Freq: Every day | ORAL | Status: DC

## 2013-05-25 MED ORDER — LORATADINE 10 MG PO TABS: 10.0000 mg | ORAL_TABLET | Freq: Every day | ORAL | Status: AC

## 2013-05-25 MED ORDER — CHLORPHEN-PE-ACETAMINOPHEN 2-5-325 & 5-325 MG PO MISC: ORAL | Status: DC

## 2013-05-25 NOTE — Telephone Encounter (Signed)
Patient is calling to request an rx for OTC allergy medicine. She would like [place] to send this to [facility] in [place]. Patient states that she has $200 that she must use on prescriptions and has until the end of the year. She hates to lose the money at the end of the year and wants to know if maybe Dr. Laury Axon can also rx OTC cold medicine as well. Please advise.

## 2013-05-25 NOTE — Telephone Encounter (Signed)
claritn 10 mg #90 1 po qd prn 3 refills Which cold med does she normally use?

## 2013-05-25 NOTE — Telephone Encounter (Signed)
Discussed with patient and she requested Aleve, Tylenol Cold sinus, and Claritin. Med's printed and left at check in and the patient will pick up on Monday.       KP

## 2013-05-25 NOTE — Telephone Encounter (Signed)
Please advise      KP 

## 2013-06-20 ENCOUNTER — Telehealth: Payer: Self-pay

## 2013-06-20 NOTE — Telephone Encounter (Addendum)
Spoke with patient about appointment on Friday, June 22, 2013 at 0930.  Pt stated understanding, however, was unable to complete pre visit call due to time.  She stated that she would rather go over information during scheduled visit.    Pap- 08/31/07-Due  Flu- Due Tdap-09/07/10

## 2013-06-22 ENCOUNTER — Encounter: Payer: Self-pay | Admitting: Lab

## 2013-06-22 ENCOUNTER — Ambulatory Visit (INDEPENDENT_AMBULATORY_CARE_PROVIDER_SITE_OTHER): Payer: BC Managed Care – PPO | Admitting: Family Medicine

## 2013-06-22 ENCOUNTER — Encounter: Payer: Self-pay | Admitting: Family Medicine

## 2013-06-22 VITALS — BP 126/88 | HR 83 | Temp 98.3°F | Ht 66.0 in | Wt 210.4 lb

## 2013-06-22 DIAGNOSIS — E785 Hyperlipidemia, unspecified: Secondary | ICD-10-CM

## 2013-06-22 DIAGNOSIS — N390 Urinary tract infection, site not specified: Secondary | ICD-10-CM

## 2013-06-22 DIAGNOSIS — Z0000 Encounter for general adult medical examination without abnormal findings: Secondary | ICD-10-CM

## 2013-06-22 DIAGNOSIS — R252 Cramp and spasm: Secondary | ICD-10-CM

## 2013-06-22 DIAGNOSIS — E669 Obesity, unspecified: Secondary | ICD-10-CM | POA: Insufficient documentation

## 2013-06-22 LAB — LIPID PANEL
CHOL/HDL RATIO: 5
CHOLESTEROL: 208 mg/dL — AB (ref 0–200)
HDL: 43.4 mg/dL (ref >39.00)
Triglycerides: 103 mg/dL (ref 0.0–149.0)
VLDL: 20.6 mg/dL (ref 0.0–40.0)

## 2013-06-22 LAB — POCT URINALYSIS DIPSTICK
Bilirubin, UA: NEGATIVE
Glucose, UA: NEGATIVE
Ketones, UA: NEGATIVE
NITRITE UA: NEGATIVE
PH UA: 6.5
PROTEIN UA: NEGATIVE
RBC UA: NEGATIVE
SPEC GRAV UA: 1.015
UROBILINOGEN UA: 0.2

## 2013-06-22 LAB — BASIC METABOLIC PANEL
BUN: 12 mg/dL (ref 6–23)
CALCIUM: 8.7 mg/dL (ref 8.4–10.5)
CO2: 26 meq/L (ref 19–32)
Chloride: 105 mEq/L (ref 96–112)
Creatinine, Ser: 0.8 mg/dL (ref 0.4–1.2)
GFR: 81.44 mL/min (ref >60.00)
GLUCOSE: 80 mg/dL (ref 70–99)
Potassium: 3.6 mEq/L (ref 3.5–5.1)
SODIUM: 138 meq/L (ref 135–145)

## 2013-06-22 LAB — CBC WITH DIFFERENTIAL/PLATELET
Basophils Absolute: 0 K/uL (ref 0.0–0.1)
Basophils Relative: 0.2 % (ref 0.0–3.0)
Eosinophils Absolute: 0.1 K/uL (ref 0.0–0.7)
Eosinophils Relative: 2.3 % (ref 0.0–5.0)
HCT: 42.7 % (ref 36.0–46.0)
Hemoglobin: 14.6 g/dL (ref 12.0–15.0)
Lymphocytes Relative: 35.7 % (ref 12.0–46.0)
Lymphs Abs: 2.2 K/uL (ref 0.7–4.0)
MCHC: 34.1 g/dL (ref 30.0–36.0)
MCV: 88.6 fl (ref 78.0–100.0)
Monocytes Absolute: 0.4 K/uL (ref 0.1–1.0)
Monocytes Relative: 5.9 % (ref 3.0–12.0)
Neutro Abs: 3.5 K/uL (ref 1.4–7.7)
Neutrophils Relative %: 55.9 % (ref 43.0–77.0)
Platelets: 185 K/uL (ref 150.0–400.0)
RBC: 4.82 Mil/uL (ref 3.87–5.11)
RDW: 13.1 % (ref 11.5–14.6)
WBC: 6.3 K/uL (ref 4.5–10.5)

## 2013-06-22 LAB — TSH: TSH: 0.97 u[IU]/mL (ref 0.35–5.50)

## 2013-06-22 LAB — HEPATIC FUNCTION PANEL
ALBUMIN: 4 g/dL (ref 3.5–5.2)
ALT: 67 U/L — ABNORMAL HIGH (ref 0–35)
AST: 44 U/L — AB (ref 0–37)
Alkaline Phosphatase: 70 U/L (ref 39–117)
BILIRUBIN TOTAL: 1 mg/dL (ref 0.3–1.2)
Bilirubin, Direct: 0.1 mg/dL (ref 0.0–0.3)
Total Protein: 7 g/dL (ref 6.0–8.3)

## 2013-06-22 LAB — LDL CHOLESTEROL, DIRECT: Direct LDL: 155.5 mg/dL

## 2013-06-22 NOTE — Progress Notes (Signed)
Subjective:     Jill Horn is a 38 y.o. female and is here for a comprehensive physical exam. The patient reports no problems.  History   Social History  . Marital Status: Married    Spouse Name: N/A    Number of Children: N/A  . Years of Education: N/A   Occupational History  . Pharm Rep    Social History Main Topics  . Smoking status: Never Smoker   . Smokeless tobacco: Never Used  . Alcohol Use: No  . Drug Use: No  . Sexual Activity: Yes    Birth Control/ Protection: None   Other Topics Concern  . Not on file   Social History Narrative  . No narrative on file   Health Maintenance  Topic Date Due  . Influenza Vaccine  06/22/2014  . Pap Smear  01/06/2016  . Tetanus/tdap  09/06/2020    The following portions of the patient's history were reviewed and updated as appropriate:  She  has a past medical history of Allergy and Hyperlipidemia. She  does not have any pertinent problems on file. She  has past surgical history that includes Cesarean section and Carpal tunnel release (07/2006). Her family history includes Hyperlipidemia in an other family member; Hypertension in an other family member; Lung cancer in her paternal grandfather. She  reports that she has never smoked. She has never used smokeless tobacco. She reports that she does not drink alcohol or use illicit drugs. She has a current medication list which includes the following prescription(s): albuterol, chlorphen-phenyleph-apap, fluticasone, fluticasone, loratadine, and naproxen sodium. Current Outpatient Prescriptions on File Prior to Visit  Medication Sig Dispense Refill  . albuterol (PROAIR HFA) 108 (90 BASE) MCG/ACT inhaler Inhale 2 puffs into the lungs 4 (four) times daily as needed.        . Chlorphen-Phenyleph-APAP 2-5-325 & 5-325 MG MISC 1 tab by mouth as needed  90 each  3  . fluticasone (FLONASE) 50 MCG/ACT nasal spray 2 sprays by Nasal route daily.  16 g  3  . fluticasone (FLONASE) 50  MCG/ACT nasal spray Place 2 sprays into the nose daily.  16 g  6  . loratadine (CLARITIN) 10 MG tablet Take 1 tablet (10 mg total) by mouth daily.  90 tablet  3  . naproxen sodium (ALEVE) 220 MG tablet Take 1 tablet (220 mg total) by mouth 2 (two) times daily with a meal.  90 tablet  3   No current facility-administered medications on file prior to visit.   She is allergic to amoxicillin and prednisone..  Review of Systems Review of Systems  Constitutional: Negative for activity change, appetite change and fatigue.  HENT: Negative for hearing loss, congestion, tinnitus and ear discharge.  dentist q86m Eyes: Negative for visual disturbance (see optho q1y -- vision corrected to 20/20 with glasses).  Respiratory: Negative for cough, chest tightness and shortness of breath.   Cardiovascular: Negative for chest pain, palpitations and leg swelling.  Gastrointestinal: Negative for abdominal pain, diarrhea, constipation and abdominal distention.  Genitourinary: Negative for urgency, frequency, decreased urine volume and difficulty urinating.  Musculoskeletal: Negative for back pain, arthralgias and gait problem.  Skin: Negative for color change, pallor and rash.  Neurological: Negative for dizziness, light-headedness, numbness and headaches.  Hematological: Negative for adenopathy. Does not bruise/bleed easily.  Psychiatric/Behavioral: Negative for suicidal ideas, confusion, sleep disturbance, self-injury, dysphoric mood, decreased concentration and agitation.       Objective:    BP 126/88  Pulse  83  Temp(Src) 98.3 F (36.8 C) (Oral)  Ht 5\' 6"  (1.676 m)  Wt 210 lb 6.4 oz (95.437 kg)  BMI 33.98 kg/m2  SpO2 98%  LMP 05/28/2013 General appearance: alert, cooperative, appears stated age and no distress Head: Normocephalic, without obvious abnormality, atraumatic Eyes: conjunctivae/corneas clear. PERRL, EOM's intact. Fundi benign. Ears: normal TM's and external ear canals both ears Nose:  Nares normal. Septum midline. Mucosa normal. No drainage or sinus tenderness. Throat: lips, mucosa, and tongue normal; teeth and gums normal Neck: no adenopathy, supple, symmetrical, trachea midline and thyroid not enlarged, symmetric, no tenderness/mass/nodules Back: symmetric, no curvature. ROM normal. No CVA tenderness. Lungs: clear to auscultation bilaterally Breasts: gyn Heart: regular rate and rhythm, S1, S2 normal, no murmur, click, rub or gallop Abdomen: soft, non-tender; bowel sounds normal; no masses,  no organomegaly Pelvic: deferred--gyn Extremities: extremities normal, atraumatic, no cyanosis or edema Pulses: 2+ and symmetric Skin: Skin color, texture, turgor normal. No rashes or lesions Lymph nodes: Cervical, supraclavicular, and axillary nodes normal. Neurologic: Alert and oriented X 3, normal strength and tone. Normal symmetric reflexes. Normal coordination and gait Psych- no depression, no anxiety      Assessment:    Healthy female exam.      Plan:    ghm utd Check labs See After Visit Summary for Counseling Recommendations

## 2013-06-22 NOTE — Progress Notes (Signed)
Pre visit review using our clinic review tool, if applicable. No additional management support is needed unless otherwise documented below in the visit note. 

## 2013-06-22 NOTE — Patient Instructions (Signed)

## 2013-06-24 LAB — URINE CULTURE
COLONY COUNT: NO GROWTH
ORGANISM ID, BACTERIA: NO GROWTH

## 2013-06-24 NOTE — Assessment & Plan Note (Signed)
Check labs Cont' meds 

## 2013-06-27 LAB — VITAMIN D 1,25 DIHYDROXY
VITAMIN D 1, 25 (OH) TOTAL: 47 pg/mL (ref 18–72)
Vitamin D2 1, 25 (OH)2: <8 pg/mL
Vitamin D3 1, 25 (OH)2: 47 pg/mL

## 2013-07-18 ENCOUNTER — Ambulatory Visit (INDEPENDENT_AMBULATORY_CARE_PROVIDER_SITE_OTHER): Payer: BC Managed Care – PPO | Admitting: Family Medicine

## 2013-07-18 ENCOUNTER — Encounter: Payer: Self-pay | Admitting: Family Medicine

## 2013-07-18 VITALS — BP 140/80 | HR 97 | Temp 98.5°F | Wt 216.0 lb

## 2013-07-18 DIAGNOSIS — R05 Cough: Secondary | ICD-10-CM

## 2013-07-18 DIAGNOSIS — J019 Acute sinusitis, unspecified: Secondary | ICD-10-CM

## 2013-07-18 DIAGNOSIS — R059 Cough, unspecified: Secondary | ICD-10-CM

## 2013-07-18 MED ORDER — GUAIFENESIN-CODEINE 100-10 MG/5ML PO SYRP: ORAL_SOLUTION | ORAL | Status: DC

## 2013-07-18 MED ORDER — CLARITHROMYCIN ER 500 MG PO TB24: 1000.0000 mg | ORAL_TABLET | Freq: Every day | ORAL | Status: AC

## 2013-07-18 NOTE — Patient Instructions (Signed)

## 2013-07-18 NOTE — Progress Notes (Signed)
Pre visit review using our clinic review tool, if applicable. No additional management support is needed unless otherwise documented below in the visit note. 

## 2013-07-18 NOTE — Progress Notes (Signed)
  Subjective:     Jill Horn is a 38 y.o. female who presents for evaluation of sinus pain. Symptoms include: congestion, cough, facial pain, nasal congestion and sinus pressure. Onset of symptoms was 2 weeks ago. Symptoms have been gradually worsening since that time. Past history is significant for no history of pneumonia or bronchitis. Patient is a non-smoker.  The following portions of the patient's history were reviewed and updated as appropriate: allergies, current medications, past family history, past medical history, past social history, past surgical history and problem list.  Review of Systems Pertinent items are noted in HPI.   Objective:    BP 140/80  Pulse 97  Temp(Src) 98.5 F (36.9 C) (Oral)  Wt 216 lb (97.977 kg)  SpO2 98%  LMP 05/28/2013 General appearance: alert, cooperative, appears stated age and no distress Ears: dull and + fluid b/l  Nose: green discharge, moderate congestion, turbinates red, swollen, sinus tenderness bilateral Throat: abnormal findings: mild oropharyngeal erythema and PND Neck: mild anterior cervical adenopathy, supple, symmetrical, trachea midline and thyroid not enlarged, symmetric, no tenderness/mass/nodules Lungs: clear to auscultation bilaterally Heart: S1, S2 normal    Assessment:    Acute bacterial sinusitis.    Plan:    Nasal steroids per medication orders. Antihistamines per medication orders. Biaxin per medication orders.

## 2013-09-03 ENCOUNTER — Encounter: Payer: Self-pay | Admitting: Family Medicine

## 2013-09-03 ENCOUNTER — Ambulatory Visit (INDEPENDENT_AMBULATORY_CARE_PROVIDER_SITE_OTHER): Payer: BC Managed Care – PPO | Admitting: Family Medicine

## 2013-09-03 VITALS — BP 118/78 | HR 110 | Temp 98.8°F | Wt 213.0 lb

## 2013-09-03 DIAGNOSIS — J019 Acute sinusitis, unspecified: Secondary | ICD-10-CM

## 2013-09-03 DIAGNOSIS — J209 Acute bronchitis, unspecified: Secondary | ICD-10-CM

## 2013-09-03 MED ORDER — LEVOFLOXACIN 500 MG PO TABS: 500.0000 mg | ORAL_TABLET | Freq: Every day | ORAL | Status: DC

## 2013-09-03 MED ORDER — GUAIFENESIN-CODEINE 100-10 MG/5ML PO SYRP: ORAL_SOLUTION | ORAL | Status: DC

## 2013-09-03 MED ORDER — BUDESONIDE-FORMOTEROL FUMARATE 80-4.5 MCG/ACT IN AERO: 2.0000 | INHALATION_SPRAY | Freq: Two times a day (BID) | RESPIRATORY_TRACT | Status: DC

## 2013-09-03 NOTE — Progress Notes (Signed)
  Subjective:     Jill Horn is a 38 y.o. female who presents for evaluation of symptoms of a URI. Symptoms include bilateral ear pressure/pain, congestion, facial pain, nasal congestion, productive cough with  green colored sputum and shortness of breath. Onset of symptoms was 2 weeks ago, and has been rapidly worsening since that time. Treatment to date: antibiotics and cough suppressants.  The following portions of the patient's history were reviewed and updated as appropriate: allergies, current medications, past family history, past medical history, past social history, past surgical history and problem list.  Review of Systems Pertinent items are noted in HPI.   Objective:    BP 118/78  Pulse 110  Temp(Src) 98.8 F (37.1 C) (Oral)  Wt 213 lb (96.616 kg)  SpO2 97% General appearance: alert, cooperative, appears stated age and no distress Ears: normal TM's and external ear canals both ears Nose: green discharge, moderate congestion, turbinates red, swollen, sinus tenderness bilateral Throat: lips, mucosa, and tongue normal; teeth and gums normal Neck: mild anterior cervical adenopathy, supple, symmetrical, trachea midline and thyroid not enlarged, symmetric, no tenderness/mass/nodules Lungs: diminished breath sounds bilaterally Heart: S1, S2 normal   Assessment:    bronchitis and sinusitis   Plan:    Discussed diagnosis and treatment of URI. Suggested symptomatic OTC remedies. Nasal saline spray for congestion. levaquin per orders. Follow up as needed.  symbicort 2 puffs bid

## 2013-09-03 NOTE — Patient Instructions (Signed)

## 2013-09-03 NOTE — Progress Notes (Signed)
Pre visit review using our clinic review tool, if applicable. No additional management support is needed unless otherwise documented below in the visit note. 

## 2013-10-08 ENCOUNTER — Encounter: Payer: Self-pay | Admitting: Family Medicine

## 2013-11-05 ENCOUNTER — Ambulatory Visit (INDEPENDENT_AMBULATORY_CARE_PROVIDER_SITE_OTHER): Payer: BC Managed Care – PPO | Admitting: Family Medicine

## 2013-11-05 ENCOUNTER — Ambulatory Visit (HOSPITAL_BASED_OUTPATIENT_CLINIC_OR_DEPARTMENT_OTHER)
Admission: RE | Admit: 2013-11-05 | Discharge: 2013-11-05 | Disposition: A | Discharge status: Home / Self Care | Payer: BC Managed Care – PPO | Source: Ambulatory Visit | Attending: Family Medicine | Admitting: Family Medicine

## 2013-11-05 ENCOUNTER — Encounter: Payer: Self-pay | Admitting: Family Medicine

## 2013-11-05 VITALS — BP 112/80 | HR 100 | Temp 98.4°F | Wt 215.0 lb

## 2013-11-05 DIAGNOSIS — J209 Acute bronchitis, unspecified: Secondary | ICD-10-CM

## 2013-11-05 DIAGNOSIS — R05 Cough: Secondary | ICD-10-CM | POA: Insufficient documentation

## 2013-11-05 DIAGNOSIS — R059 Cough, unspecified: Secondary | ICD-10-CM | POA: Insufficient documentation

## 2013-11-05 DIAGNOSIS — J9809 Other diseases of bronchus, not elsewhere classified: Secondary | ICD-10-CM

## 2013-11-05 DIAGNOSIS — R062 Wheezing: Secondary | ICD-10-CM

## 2013-11-05 DIAGNOSIS — J9801 Acute bronchospasm: Secondary | ICD-10-CM | POA: Insufficient documentation

## 2013-11-05 DIAGNOSIS — J398 Other specified diseases of upper respiratory tract: Secondary | ICD-10-CM

## 2013-11-05 DIAGNOSIS — J988 Other specified respiratory disorders: Secondary | ICD-10-CM

## 2013-11-05 MED ORDER — LEVOFLOXACIN 500 MG PO TABS: 500.0000 mg | ORAL_TABLET | Freq: Every day | ORAL | Status: DC

## 2013-11-05 MED ORDER — ALBUTEROL SULFATE (2.5 MG/3ML) 0.083% IN NEBU
2.5000 mg | INHALATION_SOLUTION | Freq: Once | RESPIRATORY_TRACT | Status: AC
  Administered 2013-11-05: 2.5 mg via RESPIRATORY_TRACT

## 2013-11-05 MED ORDER — METHYLPREDNISOLONE ACETATE 80 MG/ML IJ SUSP
80.0000 mg | Freq: Once | INTRAMUSCULAR | Status: AC
  Administered 2013-11-05: 80 mg via INTRAMUSCULAR

## 2013-11-05 NOTE — Progress Notes (Signed)
Pre visit review using our clinic review tool, if applicable. No additional management support is needed unless otherwise documented below in the visit note. 

## 2013-11-05 NOTE — Progress Notes (Signed)
  Subjective:     Jill Horn is a 38 y.o. female here for evaluation of a cough. Onset of symptoms was several weeks ago. Symptoms have been gradually worsening since that time. The cough is barky and productive and is aggravated by exercise, infection, pollens and reclining position. Associated symptoms include: postnasal drip, shortness of breath, sputum production and wheezing. Patient does not have a history of asthma. Patient does have a history of environmental allergens. Patient has not traveled recently. Patient does not have a history of smoking. Patient has not had a previous chest x-ray. Patient has not had a PPD done.  The following portions of the patient's history were reviewed and updated as appropriate: allergies, current medications, past family history, past medical history, past social history, past surgical history and problem list.  Review of Systems Pertinent items are noted in HPI.    Objective:    Oxygen saturation 95% on room air BP 112/80  Pulse 100  Temp(Src) 98.4 F (36.9 C) (Oral)  Wt 215 lb (97.523 kg)  SpO2 95%  LMP 10/08/2013 General appearance: alert, cooperative, appears stated age and mild distress Ears: normal TM's and external ear canals both ears Nose: Nares normal. Septum midline. Mucosa normal. No drainage or sinus tenderness. Throat: lips, mucosa, and tongue normal; teeth and gums normal Neck: moderate anterior cervical adenopathy, supple, symmetrical, trachea midline and thyroid not enlarged, symmetric, no tenderness/mass/nodules Lungs: wheezes bilaterally Heart: S1, S2 normal    Assessment:    Acute Bronchitis and bronchospasm    Plan:    Antibiotics per medication orders. Antitussives per medication orders. Avoid exposure to tobacco smoke and fumes. B-agonist inhaler. Call if shortness of breath worsens, blood in sputum, change in character of cough, development of fever or chills, inability to maintain nutrition and hydration. Avoid  exposure to tobacco smoke and fumes. Chest x-ray. Steroid inhaler as ordered. Trial of antihistamines.

## 2013-11-05 NOTE — Patient Instructions (Signed)
Bronchospasm, Adult A bronchospasm is a spasm or tightening of the airways going into the lungs. During a bronchospasm breathing becomes more difficult because the airways get smaller. When this happens there can be coughing, a whistling sound when breathing (wheezing), and difficulty breathing. Bronchospasm is often associated with asthma, but not all patients who experience a bronchospasm have asthma. CAUSES  A bronchospasm is caused by inflammation or irritation of the airways. The inflammation or irritation may be triggered by:   Allergies (such as to animals, pollen, food, or mold). Allergens that cause bronchospasm may cause wheezing immediately after exposure or many hours later.   Infection. Viral infections are believed to be the most common cause of bronchospasm.   Exercise.   Irritants (such as pollution, cigarette smoke, strong odors, aerosol sprays, and paint fumes).   Weather changes. Winds increase molds and pollens in the air. Rain refreshes the air by washing irritants out. Cold air may cause inflammation.   Stress and emotional upset.  SIGNS AND SYMPTOMS   Wheezing.   Excessive nighttime coughing.   Frequent or severe coughing with a simple cold.   Chest tightness.   Shortness of breath.  DIAGNOSIS  Bronchospasm is usually diagnosed through a history and physical exam. Tests, such as chest X-rays, are sometimes done to look for other conditions. TREATMENT   Inhaled medicines can be given to open up your airways and help you breathe. The medicines can be given using either an inhaler or a nebulizer machine.  Corticosteroid medicines may be given for severe bronchospasm, usually when it is associated with asthma. HOME CARE INSTRUCTIONS   Always have a plan prepared for seeking medical care. Know when to call your health care provider and local emergency services (911 in the [place] Know where you can access local emergency care.  Only take medicines as  directed by your health care provider.  If you were prescribed an inhaler or nebulizer machine, ask your health care provider to explain how to use it correctly. Always use a spacer with your inhaler if you were given one.  It is necessary to remain calm during an attack. Try to relax and breathe more slowly.  Control your home environment in the following ways:   Change your heating and air conditioning filter at least once a month.   Limit your use of fireplaces and wood stoves.  Do not smoke and do not allow smoking in your home.   Avoid exposure to perfumes and fragrances.   Get rid of pests (such as roaches and mice) and their droppings.   Throw away plants if you see mold on them.   Keep your house clean and dust free.   Replace carpet with wood, tile, or vinyl flooring. Carpet can trap dander and dust.   Use allergy-proof pillows, mattress covers, and box spring covers.   Wash bed sheets and blankets every week in hot water and dry them in a dryer.   Use blankets that are made of polyester or cotton.   Wash hands frequently. SEEK MEDICAL CARE IF:   You have muscle aches.   You have chest pain.   The sputum changes from clear or white to yellow, green, gray, or bloody.   The sputum you cough up gets thicker.   There are problems that may be related to the medicine you are given, such as a rash, itching, swelling, or trouble breathing.  SEEK IMMEDIATE MEDICAL CARE IF:   You have worsening wheezing and coughing   even after taking your prescribed medicines.   You have increased difficulty breathing.   You develop severe chest pain. MAKE SURE YOU:   Understand these instructions.  Will watch your condition.  Will get help right away if you are not doing well or get worse. Document Released: 05/27/2003 Document Revised: 01/24/2013 Document Reviewed: 11/13/2012 [facility] Patient Information 2014 [place].

## 2013-12-09 ENCOUNTER — Encounter: Payer: Self-pay | Admitting: Family Medicine

## 2013-12-10 NOTE — Telephone Encounter (Signed)
Spoke with patient and advised that I would talk with [facility] today. Spoke with Shirlean Mylar at [place] who will investigate and call GJ back.

## 2013-12-19 ENCOUNTER — Institutional Professional Consult (permissible substitution): Payer: BC Managed Care – PPO | Admitting: Critical Care Medicine

## 2014-01-29 ENCOUNTER — Encounter: Payer: Self-pay | Admitting: Family Medicine

## 2014-06-11 IMAGING — CR DG CHEST 2V
2 series · 2 of 2 positions shown · non-contrast
Comparison: Two view chest dated 08/23/2010

CLINICAL DATA: cough, bronchospasm

EXAM:
CHEST  2 VIEW

[w chest pa]
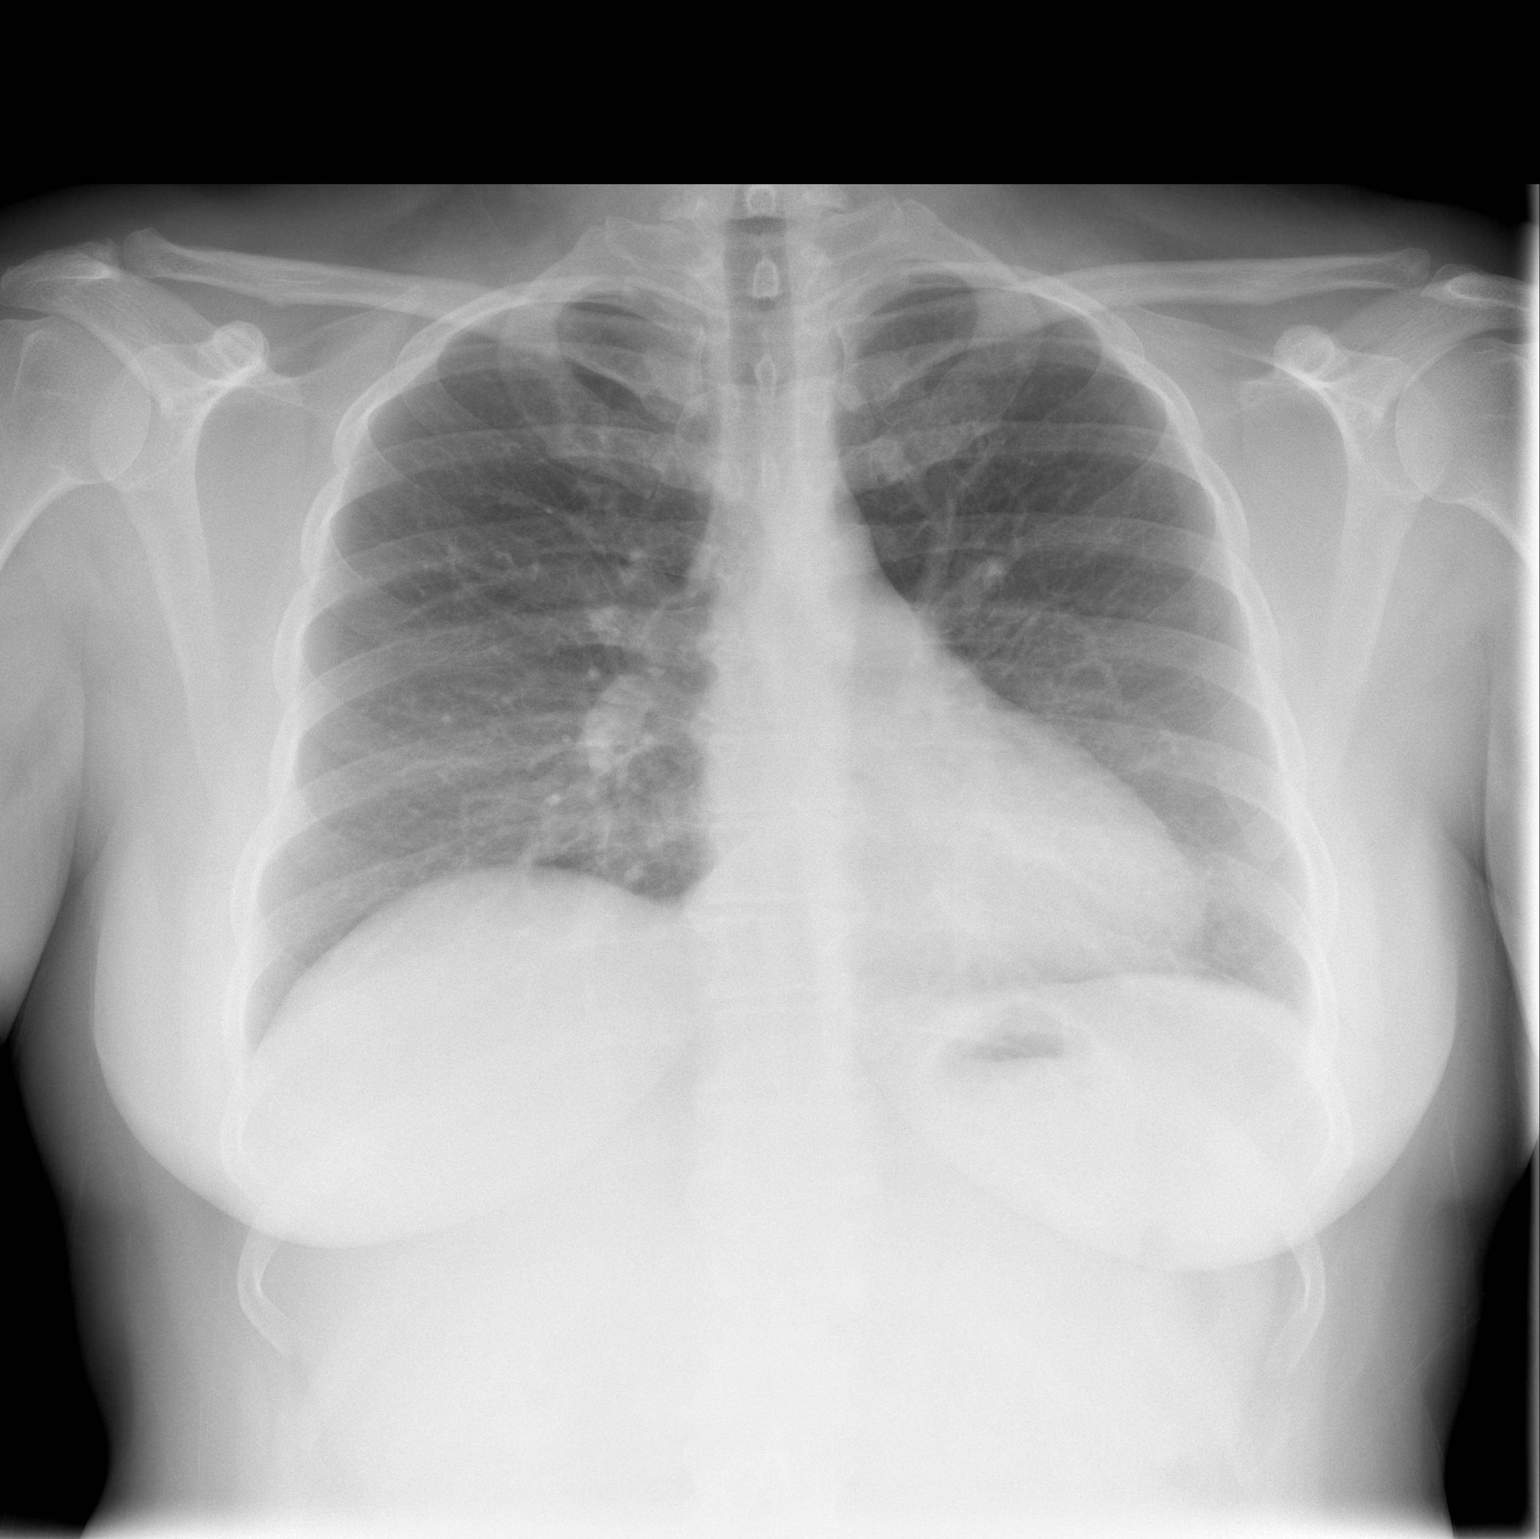

[w chest lat]
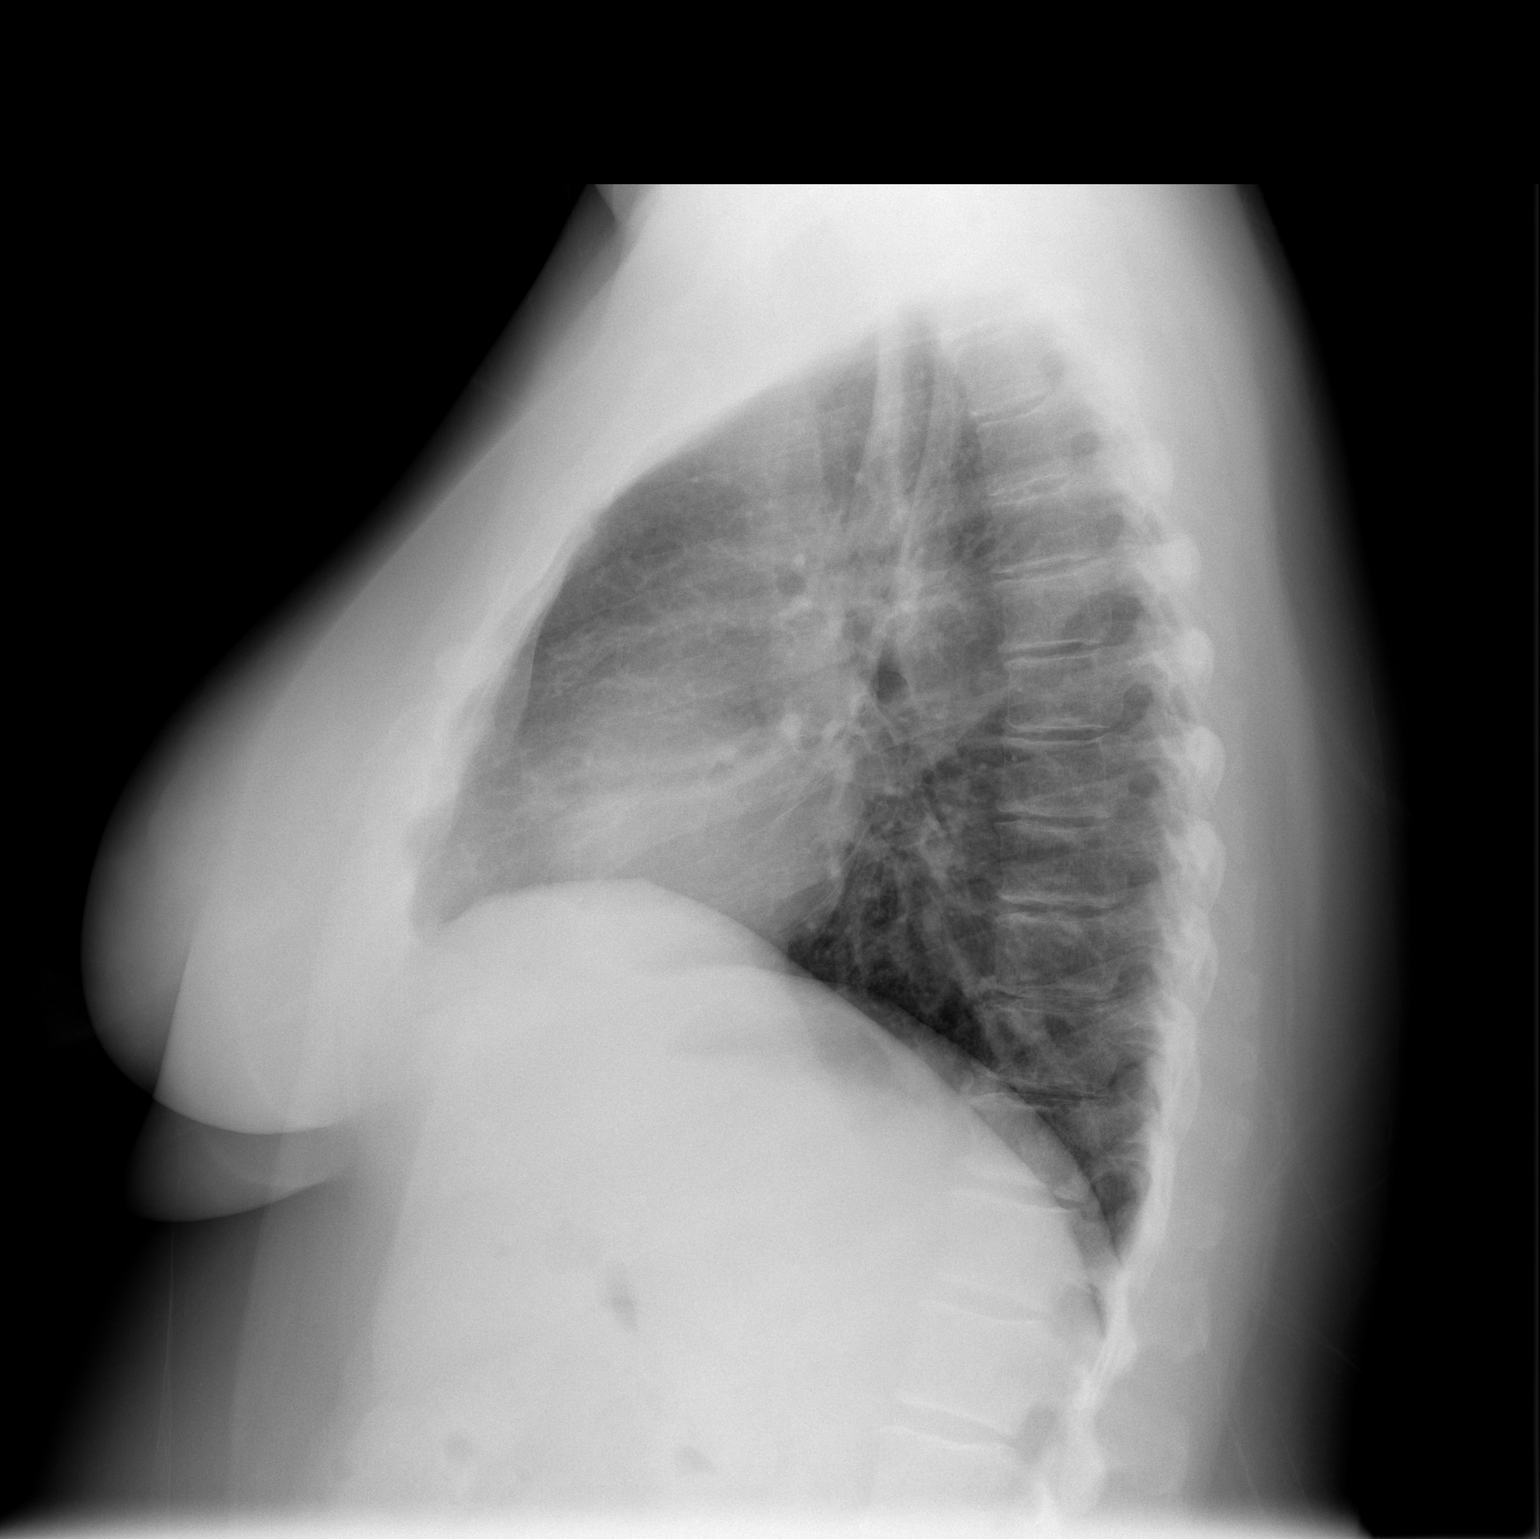

[2 of 2 positions shown; findings below may reference images not displayed]

FINDINGS: The heart size and mediastinal contours are within normal limits.
Both lungs are clear. The visualized skeletal structures are
unremarkable.
IMPRESSION: No active cardiopulmonary disease.

## 2014-08-15 ENCOUNTER — Other Ambulatory Visit: Payer: Self-pay

## 2014-08-15 DIAGNOSIS — Z1239 Encounter for other screening for malignant neoplasm of breast: Secondary | ICD-10-CM

## 2014-09-23 ENCOUNTER — Ambulatory Visit
Admission: RE | Admit: 2014-09-23 | Discharge: 2014-09-23 | Disposition: A | Discharge status: Home / Self Care | Payer: BLUE CROSS/BLUE SHIELD | Source: Ambulatory Visit

## 2014-09-23 ENCOUNTER — Other Ambulatory Visit: Payer: Self-pay

## 2014-09-23 DIAGNOSIS — Z1231 Encounter for screening mammogram for malignant neoplasm of breast: Secondary | ICD-10-CM

## 2014-09-24 ENCOUNTER — Other Ambulatory Visit: Payer: Self-pay | Admitting: Obstetrics and Gynecology

## 2014-09-24 DIAGNOSIS — R928 Other abnormal and inconclusive findings on diagnostic imaging of breast: Secondary | ICD-10-CM

## 2014-09-27 ENCOUNTER — Ambulatory Visit
Admission: RE | Admit: 2014-09-27 | Discharge: 2014-09-27 | Disposition: A | Discharge status: Home / Self Care | Payer: BLUE CROSS/BLUE SHIELD | Source: Ambulatory Visit | Attending: Obstetrics and Gynecology | Admitting: Obstetrics and Gynecology

## 2014-09-27 DIAGNOSIS — R928 Other abnormal and inconclusive findings on diagnostic imaging of breast: Secondary | ICD-10-CM

## 2015-04-08 ENCOUNTER — Ambulatory Visit (INDEPENDENT_AMBULATORY_CARE_PROVIDER_SITE_OTHER): Payer: BLUE CROSS/BLUE SHIELD | Admitting: Behavioral Health

## 2015-04-08 DIAGNOSIS — Z23 Encounter for immunization: Secondary | ICD-10-CM | POA: Diagnosis not present

## 2015-04-08 NOTE — Progress Notes (Signed)
Pre visit review using our clinic review tool, if applicable. No additional management support is needed unless otherwise documented below in the visit note. 

## 2015-05-16 ENCOUNTER — Encounter: Payer: Self-pay | Admitting: Family Medicine

## 2015-06-12 ENCOUNTER — Telehealth: Payer: Self-pay | Admitting: Family Medicine

## 2015-06-12 NOTE — Telephone Encounter (Signed)
Dr. Etter Sjogren, please advise. Can pt schedule nurse visit or does she need OV?

## 2015-06-12 NOTE — Telephone Encounter (Signed)
Tb is fine We have labs in system that say she is MMR and varicella immune---  Which would mean she doesn't need them---- did she get different results somewhere else?

## 2015-06-12 NOTE — Telephone Encounter (Signed)
Caller name: Zhaniya  ° °Relationship to patient: Self  ° °Can be reached: [phone] ° °Reason for call: Pt is requesting immunization shots. She says that she need her MMR, TB and also varicella.  ° ° °Please advise for scheduling °

## 2015-06-12 NOTE — Telephone Encounter (Signed)
Attempted to call pt, no answer. No message left as voicemail box was identified as "Teacher, music message sent to patient addressing concerns.

## 2015-06-13 NOTE — Telephone Encounter (Signed)
immunizations mailed.     KP

## 2015-06-13 NOTE — Telephone Encounter (Signed)
Jill Horn, can you mail pt a letter with her varicella and MMR titer results from 09/2010?

## 2015-06-16 ENCOUNTER — Telehealth: Payer: Self-pay | Admitting: Family Medicine

## 2015-06-16 NOTE — Telephone Encounter (Signed)
Caller name: Self  Relationship to patient:   Can be reached: [phone]   Reason for call: Patient needs TB skin test done for her job.

## 2015-06-16 NOTE — Telephone Encounter (Signed)
Schedule it, Dr.Lowne approved it last week. See previous phone note.     KP

## 2015-06-16 NOTE — Telephone Encounter (Signed)
Scheduled

## 2015-06-20 ENCOUNTER — Ambulatory Visit (INDEPENDENT_AMBULATORY_CARE_PROVIDER_SITE_OTHER): Payer: BLUE CROSS/BLUE SHIELD

## 2015-06-20 DIAGNOSIS — Z23 Encounter for immunization: Secondary | ICD-10-CM

## 2015-06-20 DIAGNOSIS — Z111 Encounter for screening for respiratory tuberculosis: Secondary | ICD-10-CM

## 2015-06-20 NOTE — Progress Notes (Signed)
Pre visit review using our clinic review tool, if applicable. No additional management support is needed unless otherwise documented below in the visit note.  Pt in for Tb skin test. Placed in LFA. Due to have it read on Monday, 06/23/15.

## 2015-06-23 LAB — TB SKIN TEST
Induration: 0 mm
TB Skin Test: NEGATIVE

## 2015-06-25 ENCOUNTER — Telehealth: Payer: Self-pay | Admitting: Family Medicine

## 2015-06-25 NOTE — Telephone Encounter (Signed)
error:[id number] ° °

## 2015-06-25 NOTE — Telephone Encounter (Addendum)
As per patient employer patient is in need of a second TB patient would like to come in Thursday or Friday so it can be read before 07/01/15 requesting orders. Please advise

## 2015-06-26 NOTE — Telephone Encounter (Signed)
Please advise      KP 

## 2015-06-26 NOTE — Telephone Encounter (Signed)
Ok to do PPD

## 2015-06-27 ENCOUNTER — Ambulatory Visit (INDEPENDENT_AMBULATORY_CARE_PROVIDER_SITE_OTHER): Payer: BLUE CROSS/BLUE SHIELD | Admitting: *Deleted

## 2015-06-27 ENCOUNTER — Telehealth: Payer: Self-pay | Admitting: Family Medicine

## 2015-06-27 DIAGNOSIS — Z111 Encounter for screening for respiratory tuberculosis: Secondary | ICD-10-CM | POA: Diagnosis not present

## 2015-06-27 NOTE — Telephone Encounter (Signed)
Appointment scheduled with nurse for 06/27/2015

## 2015-06-27 NOTE — Telephone Encounter (Signed)
Dr.Lowne has already approved it, please schedule.    KP

## 2015-06-27 NOTE — Telephone Encounter (Signed)
Caller name: Christena Relation to pt: self Call back number: [phone] Pharmacy:  Reason for call: Pt states needs another back to back TB test done twice (required from work-[employer]), pt states has to have it done today so it can be read on Monday. Please advise.

## 2015-06-27 NOTE — Telephone Encounter (Signed)
Pt scheduled today 06-27-15. Thanks

## 2015-06-27 NOTE — Progress Notes (Signed)
Pre visit review using our clinic review tool, if applicable. No additional management support is needed unless otherwise documented below in the visit note.  TB skin test placed in RFA. Pt tolerated well. Instructed pt to return to have test read on Monday 06/30/2015. Pt verbalized understanding.

## 2015-06-30 ENCOUNTER — Encounter: Payer: Self-pay | Admitting: Behavioral Health

## 2015-06-30 LAB — TB SKIN TEST
Induration: 0 mm
TB Skin Test: NEGATIVE

## 2015-07-10 ENCOUNTER — Encounter: Payer: Self-pay | Admitting: Family Medicine

## 2015-07-11 ENCOUNTER — Encounter: Payer: Self-pay | Admitting: Family Medicine

## 2015-09-26 ENCOUNTER — Other Ambulatory Visit: Payer: Self-pay

## 2015-09-26 DIAGNOSIS — Z1231 Encounter for screening mammogram for malignant neoplasm of breast: Secondary | ICD-10-CM

## 2015-10-15 ENCOUNTER — Ambulatory Visit
Admission: RE | Admit: 2015-10-15 | Discharge: 2015-10-15 | Disposition: A | Discharge status: Home / Self Care | Payer: BLUE CROSS/BLUE SHIELD | Source: Ambulatory Visit

## 2015-10-15 DIAGNOSIS — Z1231 Encounter for screening mammogram for malignant neoplasm of breast: Secondary | ICD-10-CM

## 2015-11-24 ENCOUNTER — Encounter: Payer: Self-pay | Admitting: Family Medicine

## 2015-11-24 NOTE — Telephone Encounter (Signed)
She needs earlier app

## 2015-11-24 NOTE — Telephone Encounter (Signed)
Please review and advise     KP 

## 2015-11-25 ENCOUNTER — Encounter: Payer: Self-pay | Admitting: Family Medicine

## 2015-11-25 ENCOUNTER — Other Ambulatory Visit: Payer: Self-pay | Admitting: Family Medicine

## 2015-11-25 ENCOUNTER — Ambulatory Visit: Payer: BLUE CROSS/BLUE SHIELD | Admitting: Family Medicine

## 2015-11-25 ENCOUNTER — Ambulatory Visit (HOSPITAL_BASED_OUTPATIENT_CLINIC_OR_DEPARTMENT_OTHER)
Admission: RE | Admit: 2015-11-25 | Discharge: 2015-11-25 | Disposition: A | Discharge status: Home / Self Care | Payer: BLUE CROSS/BLUE SHIELD | Source: Ambulatory Visit | Attending: Family Medicine | Admitting: Family Medicine

## 2015-11-25 ENCOUNTER — Ambulatory Visit (INDEPENDENT_AMBULATORY_CARE_PROVIDER_SITE_OTHER): Payer: BLUE CROSS/BLUE SHIELD | Admitting: Family Medicine

## 2015-11-25 VITALS — BP 124/82 | HR 99 | Temp 99.0°F | Wt 214.6 lb

## 2015-11-25 DIAGNOSIS — Z0000 Encounter for general adult medical examination without abnormal findings: Secondary | ICD-10-CM | POA: Diagnosis not present

## 2015-11-25 DIAGNOSIS — R1033 Periumbilical pain: Secondary | ICD-10-CM

## 2015-11-25 DIAGNOSIS — K760 Fatty (change of) liver, not elsewhere classified: Secondary | ICD-10-CM

## 2015-11-25 DIAGNOSIS — E559 Vitamin D deficiency, unspecified: Secondary | ICD-10-CM

## 2015-11-25 DIAGNOSIS — R748 Abnormal levels of other serum enzymes: Secondary | ICD-10-CM

## 2015-11-25 DIAGNOSIS — R7989 Other specified abnormal findings of blood chemistry: Secondary | ICD-10-CM | POA: Diagnosis not present

## 2015-11-25 LAB — LIPID PANEL
Cholesterol: 216 mg/dL — ABNORMAL HIGH (ref 0–200)
HDL: 52.2 mg/dL (ref >39.00)
LDL Cholesterol: 132 mg/dL — ABNORMAL HIGH (ref 0–99)
NONHDL: 164.29
Total CHOL/HDL Ratio: 4
Triglycerides: 159 mg/dL — ABNORMAL HIGH (ref 0.0–149.0)
VLDL: 31.8 mg/dL (ref 0.0–40.0)

## 2015-11-25 LAB — POCT URINALYSIS DIPSTICK
Bilirubin, UA: NEGATIVE
Blood, UA: NEGATIVE
Glucose, UA: NEGATIVE
KETONES UA: NEGATIVE
Leukocytes, UA: NEGATIVE
Nitrite, UA: NEGATIVE
PROTEIN UA: NEGATIVE
SPEC GRAV UA: 1.02
UROBILINOGEN UA: 0.2
pH, UA: 7

## 2015-11-25 LAB — COMPREHENSIVE METABOLIC PANEL
ALBUMIN: 4.2 g/dL (ref 3.5–5.2)
ALK PHOS: 69 U/L (ref 39–117)
ALT: 94 U/L — ABNORMAL HIGH (ref 0–35)
AST: 60 U/L — ABNORMAL HIGH (ref 0–37)
BUN: 11 mg/dL (ref 6–23)
CO2: 27 mEq/L (ref 19–32)
Calcium: 9.3 mg/dL (ref 8.4–10.5)
Chloride: 104 mEq/L (ref 96–112)
Creatinine, Ser: 0.66 mg/dL (ref 0.40–1.20)
GFR: 104.8 mL/min (ref >60.00)
Glucose, Bld: 92 mg/dL (ref 70–99)
POTASSIUM: 3.8 meq/L (ref 3.5–5.1)
Sodium: 138 mEq/L (ref 135–145)
TOTAL PROTEIN: 7 g/dL (ref 6.0–8.3)
Total Bilirubin: 0.6 mg/dL (ref 0.2–1.2)

## 2015-11-25 LAB — CBC WITH DIFFERENTIAL/PLATELET
Basophils Absolute: 0 10*3/uL (ref 0.0–0.1)
Basophils Relative: 0.3 % (ref 0.0–3.0)
EOS ABS: 0.3 10*3/uL (ref 0.0–0.7)
EOS PCT: 3.6 % (ref 0.0–5.0)
HCT: 43.9 % (ref 36.0–46.0)
HEMOGLOBIN: 14.6 g/dL (ref 12.0–15.0)
Lymphocytes Relative: 27.9 % (ref 12.0–46.0)
Lymphs Abs: 2.3 10*3/uL (ref 0.7–4.0)
MCHC: 33.3 g/dL (ref 30.0–36.0)
MCV: 88.5 fl (ref 78.0–100.0)
MONO ABS: 0.4 10*3/uL (ref 0.1–1.0)
MONOS PCT: 4.9 % (ref 3.0–12.0)
NEUTROS PCT: 63.3 % (ref 43.0–77.0)
Neutro Abs: 5.3 10*3/uL (ref 1.4–7.7)
Platelets: 224 10*3/uL (ref 150.0–400.0)
RBC: 4.96 Mil/uL (ref 3.87–5.11)
RDW: 12.9 % (ref 11.5–15.5)
WBC: 8.4 10*3/uL (ref 4.0–10.5)

## 2015-11-25 LAB — TSH: TSH: 1.3 u[IU]/mL (ref 0.35–4.50)

## 2015-11-25 LAB — POCT URINE PREGNANCY: PREG TEST UR: NEGATIVE

## 2015-11-25 MED ORDER — IOPAMIDOL (ISOVUE-300) INJECTION 61%
100.0000 mL | Freq: Once | INTRAVENOUS | Status: AC | PRN
  Administered 2015-11-25: 100 mL via INTRAVENOUS

## 2015-11-25 NOTE — Patient Instructions (Signed)

## 2015-11-25 NOTE — Progress Notes (Signed)
Pre visit review using our clinic tool,if applicable. No additional management support is needed unless otherwise documented below in the visit note.  

## 2015-11-25 NOTE — Progress Notes (Signed)
  Subjective:     Jill Horn is a 40 y.o. female who presents for evaluation of abdominal pain. Onset was 6 days ago. Symptoms have been gradually worsening. The pain is described as burning, and is 4/10 in intensity. Pain is located in the periumbilical region with radiation to back and left back.  Aggravating factors: activity.  Alleviating factors: recumbency. Associated symptoms: chills. The patient denies anorexia, arthralagias, belching, constipation, diarrhea, dysuria, fever, flatus, frequency, headache, hematochezia, hematuria, melena, myalgias, nausea, sweats and vomiting.  Past Medical History  Diagnosis Date  . Allergy   . Hyperlipidemia    Patient Active Problem List   Diagnosis Date Noted  . Obesity (BMI 30-39.9) 06/22/2013  . Preventative health care 09/07/2010  . ACUTE BRONCHITIS 08/13/2010  . BENIGN NEOPLASM OF SKIN SITE UNSPECIFIED 09/04/2009  . BREAST MASS, RIGHT 12/26/2007  . HYPERLIPIDEMIA 08/31/2007  . ALLERGIC RHINITIS 08/31/2007  . URI 06/30/2007  . COUGH 06/30/2007   Past Surgical History  Procedure Laterality Date  . Cesarean section      x2  . Carpal tunnel release  07/2006   Family History  Problem Relation Age of Onset  . Hyperlipidemia    . Hypertension    . Lung cancer Paternal Grandfather    Social History   Social History  . Marital Status: Married    Spouse Name: N/A  . Number of Children: N/A  . Years of Education: N/A   Occupational History  . Pharm Rep    Social History Main Topics  . Smoking status: Never Smoker   . Smokeless tobacco: Never Used  . Alcohol Use: No  . Drug Use: No  . Sexual Activity: Yes    Birth Control/ Protection: None   Other Topics Concern  . None   Social History Narrative   Current Outpatient Prescriptions  Medication Sig Dispense Refill  . loratadine (CLARITIN) 10 MG tablet Take 1 tablet (10 mg total) by mouth daily. 90 tablet 3  . metFORMIN (GLUCOPHAGE) 500 MG tablet Take 1,000 mg by mouth 2  (two) times daily with a meal.    . Vitamin D, Ergocalciferol, (DRISDOL) 50000 units CAPS capsule Take 50,000 Units by mouth every 7 (seven) days.    . budesonide-formoterol (SYMBICORT) 80-4.5 MCG/ACT inhaler Inhale 2 puffs into the lungs 2 (two) times daily. (Patient not taking: Reported on 11/25/2015) 1 Inhaler 3  . triamcinolone (NASACORT ALLERGY 24HR) 55 MCG/ACT AERO nasal inhaler Place 2 sprays into the nose daily. Reported on 11/25/2015     No current facility-administered medications for this visit.   Allergies: Amoxicillin and Prednisone  Review of Systems Pertinent items are noted in HPI.     Objective:    BP 124/82 mmHg  Pulse 99  Temp(Src) 99 F (37.2 C) (Oral)  Wt 214 lb 9.6 oz (97.342 kg)  SpO2 98%  LMP 11/12/2015 General appearance: alert, cooperative, appears stated age and no distress Lungs: clear to auscultation bilaterally Heart: S1, S2 normal Abdomen: abnormal findings:  moderate tenderness in the periumbilical area ---tenderness L flank with palpations   Assessment:    Abdominal pain,? Etiology-- r/o AP .    Plan:    See orders for lab and imaging studies. Adhere to simple, bland diet. Further follow-up plans will be based on outcome of lab/imaging studies; see orders. Follow up as needed.

## 2015-11-26 ENCOUNTER — Encounter: Payer: Self-pay | Admitting: Family Medicine

## 2015-11-28 LAB — VITAMIN D 1,25 DIHYDROXY
Vitamin D 1, 25 (OH)2 Total: 62 pg/mL (ref 18–72)
Vitamin D2 1, 25 (OH)2: <8 pg/mL
Vitamin D3 1, 25 (OH)2: 62 pg/mL

## 2015-12-02 ENCOUNTER — Ambulatory Visit: Payer: BLUE CROSS/BLUE SHIELD | Admitting: Family Medicine

## 2015-12-05 ENCOUNTER — Encounter: Payer: Self-pay | Admitting: Internal Medicine

## 2015-12-18 ENCOUNTER — Encounter: Payer: BLUE CROSS/BLUE SHIELD | Admitting: Family Medicine

## 2016-01-01 ENCOUNTER — Encounter: Payer: BLUE CROSS/BLUE SHIELD | Admitting: Family Medicine

## 2016-01-05 ENCOUNTER — Encounter: Payer: Self-pay | Admitting: Family Medicine

## 2016-01-07 NOTE — Telephone Encounter (Signed)
01/01/16 was the date of the appt pt was not able to keep. She has rescheduled thru mychart for 03/22/16. I have marked not to charge. Fwding to [place] to review for the left msg.

## 2016-02-12 ENCOUNTER — Ambulatory Visit: Payer: BLUE CROSS/BLUE SHIELD | Admitting: Internal Medicine

## 2016-02-17 DIAGNOSIS — D485 Neoplasm of uncertain behavior of skin: Secondary | ICD-10-CM | POA: Diagnosis not present

## 2016-02-17 DIAGNOSIS — D225 Melanocytic nevi of trunk: Secondary | ICD-10-CM | POA: Diagnosis not present

## 2016-02-17 DIAGNOSIS — L821 Other seborrheic keratosis: Secondary | ICD-10-CM | POA: Diagnosis not present

## 2016-02-23 DIAGNOSIS — Z6834 Body mass index (BMI) 34.0-34.9, adult: Secondary | ICD-10-CM | POA: Diagnosis not present

## 2016-02-23 DIAGNOSIS — Z1212 Encounter for screening for malignant neoplasm of rectum: Secondary | ICD-10-CM | POA: Diagnosis not present

## 2016-02-23 DIAGNOSIS — Z01419 Encounter for gynecological examination (general) (routine) without abnormal findings: Secondary | ICD-10-CM | POA: Diagnosis not present

## 2016-03-22 ENCOUNTER — Encounter: Payer: Self-pay | Admitting: Family Medicine

## 2016-05-04 ENCOUNTER — Ambulatory Visit (INDEPENDENT_AMBULATORY_CARE_PROVIDER_SITE_OTHER): Payer: BLUE CROSS/BLUE SHIELD

## 2016-05-04 DIAGNOSIS — Z23 Encounter for immunization: Secondary | ICD-10-CM

## 2016-06-02 ENCOUNTER — Encounter: Payer: Self-pay | Admitting: Family Medicine

## 2016-06-02 ENCOUNTER — Ambulatory Visit (INDEPENDENT_AMBULATORY_CARE_PROVIDER_SITE_OTHER): Payer: BLUE CROSS/BLUE SHIELD | Admitting: Family Medicine

## 2016-06-02 VITALS — BP 126/80 | HR 90 | Temp 97.6°F | Ht 67.0 in | Wt 213.2 lb

## 2016-06-02 DIAGNOSIS — B9789 Other viral agents as the cause of diseases classified elsewhere: Secondary | ICD-10-CM | POA: Diagnosis not present

## 2016-06-02 DIAGNOSIS — J069 Acute upper respiratory infection, unspecified: Secondary | ICD-10-CM | POA: Diagnosis not present

## 2016-06-02 DIAGNOSIS — H6982 Other specified disorders of Eustachian tube, left ear: Secondary | ICD-10-CM

## 2016-06-02 MED ORDER — FLUTICASONE PROPIONATE 50 MCG/ACT NA SUSP: 2.0000 | Freq: Every day | NASAL | 0 refills | Status: DC

## 2016-06-02 MED ORDER — BENZONATATE 100 MG PO CAPS: 100.0000 mg | ORAL_CAPSULE | Freq: Three times a day (TID) | ORAL | 0 refills | Status: DC | PRN

## 2016-06-02 NOTE — Progress Notes (Signed)
Chief Complaint  Patient presents with  . Otitis Media    Pt reports drainage from LT ear, congestion, and cough x 1 week     Pt is here for left ear pain. Duration: 1 wk Progression: unchanged Associated symptoms: congestion, ear pressure, nasal congestion, runny nose and non productive cough Denies: fevers, bleeding, or discharge from ear  Treatment to date: Dayquil  ROS:  HEENT: +ear pain Costitutional: Denies fevers  Past Medical History:  Diagnosis Date  . Allergy   . Hyperlipidemia    Family History  Problem Relation Age of Onset  . Hyperlipidemia    . Hypertension    . Lung cancer Paternal Grandfather    Past Surgical History:  Procedure Laterality Date  . CARPAL TUNNEL RELEASE  07/2006  . CESAREAN SECTION     x2    BP 126/80 (BP Location: Right Arm, Patient Position: Sitting, Cuff Size: Small)   Pulse 90   Temp 97.6 F (36.4 C) (Oral)   Ht 5\' 7"  (1.702 m)   Wt 213 lb 3.2 oz (96.7 kg)   LMP 05/11/2016   SpO2 98%   BMI 33.39 kg/m  General: Awake, alert, appearing stated age 40:  L ear- Canal patent without drainage or erythema, TM is with otosclerosis, no erythema, no bulging R ear- canal patent without drainage or erythema, TM is also with otosclerosis, no erythema, no bulging Nose- nares patent and without discharge Mouth- Lips, gums and dentition unremarkable, pharynx is without erythema or exudate Neck: No adenopathy Lungs: Normal effort, no accessory muscle use Psych: Age appropriate judgment and insight, normal mood and affect  Viral URI with cough - Plan: benzonatate (TESSALON) 100 MG capsule  Dysfunction of left eustachian tube - Plan: fluticasone (FLONASE) 50 MCG/ACT nasal spray  Orders as above.  Also recommended PO antihistamine, she has loratadine at home. Discussed with her use of Flonase, which she has used before and not had any issues with. F/u prn. Pt voiced understanding and agreement to the plan.  [place],  DO 06/02/16 10:37 AM

## 2016-06-02 NOTE — Patient Instructions (Signed)
Claritin (loratadine), Allegra (fexofenadine), Zyrtec (cetirizine); these are listed in order from weakest to strongest. Generic, and therefore cheaper, options are in the parentheses.   Flonase (fluticasone); nasal spray that is over the counter. 2 sprays each nostril, once daily. Aim towards the same side eye when you spray.  There are available OTC, and the generic versions, which may be cheaper, are in parentheses. Show this to a pharmacist if you have trouble finding any of these items.  

## 2016-06-02 NOTE — Progress Notes (Signed)
Pre visit review using our clinic review tool, if applicable. No additional management support is needed unless otherwise documented below in the visit note. 

## 2016-06-28 ENCOUNTER — Encounter: Payer: BLUE CROSS/BLUE SHIELD | Admitting: Family Medicine

## 2016-06-30 IMAGING — CT CT ABD-PELV W/ CM
2 of 5 series · 16 of 46 positions shown, 18 images · IV contrast (APPLIED)
Comparison: Ultrasound of the abdomen of 09/23/2010

CLINICAL DATA: Left periumbilical abdominal pain and left low back
pain for 6 days, elevated liver function tests

EXAM:
CT ABDOMEN AND PELVIS WITH CONTRAST
TECHNIQUE: Multidetector CT imaging of the abdomen and pelvis was performed
using the standard protocol following bolus administration of
intravenous contrast.
CONTRAST:  100mL S2161L-XRR IOPAMIDOL (S2161L-XRR) INJECTION 61%

[Series 2: axial st · axial · 0.76mm/px · z∈[-466,-21]mm · 13 of 99 slices shown, 15 images]
[im 5/99  soft-tissue]
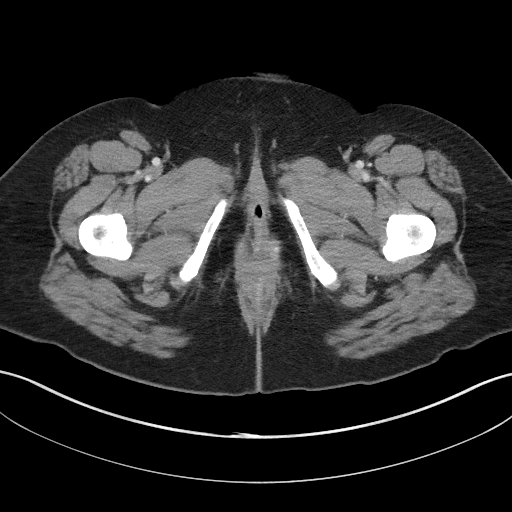
[im 5/99  bone]
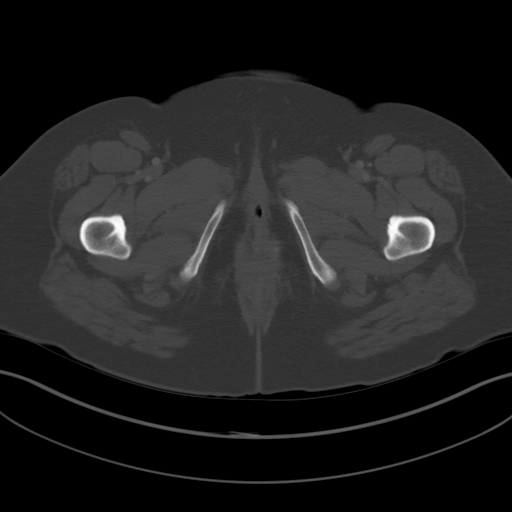
[im 15/99  soft-tissue]
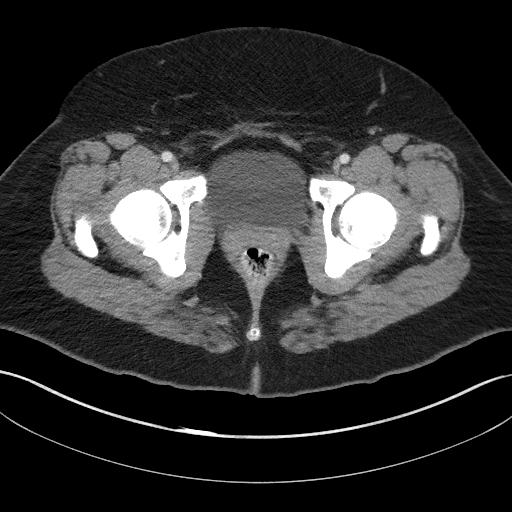
[im 20/99  soft-tissue]
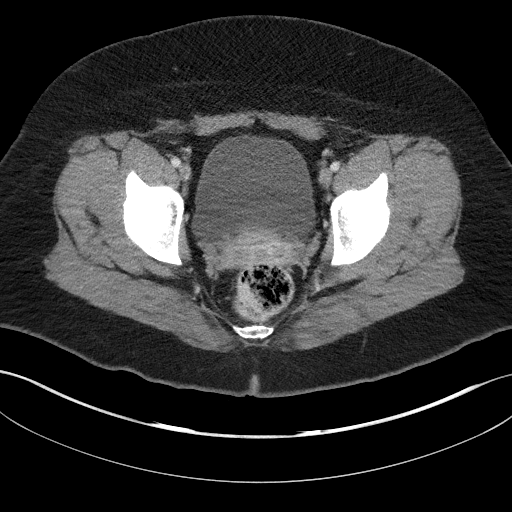
[im 30/99  soft-tissue]
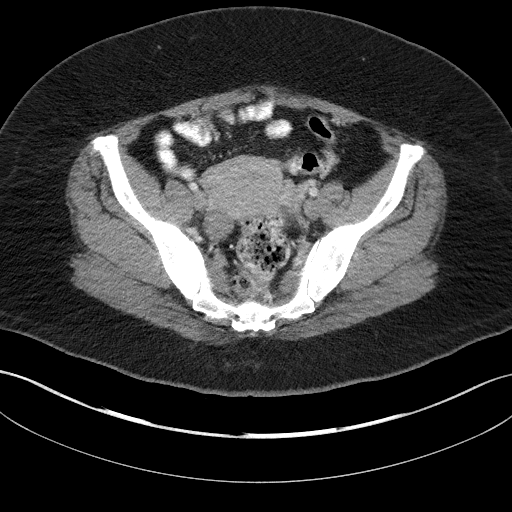
[im 35/99  soft-tissue]
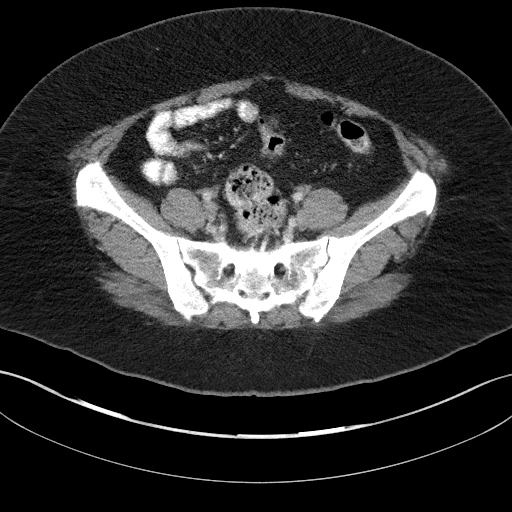
[im 45/99  soft-tissue]
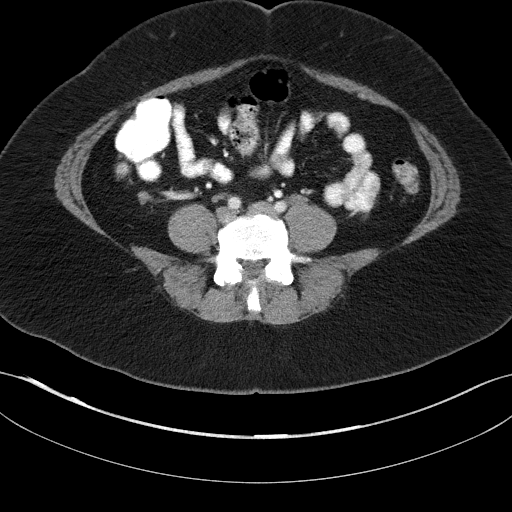
[im 50/99  soft-tissue]
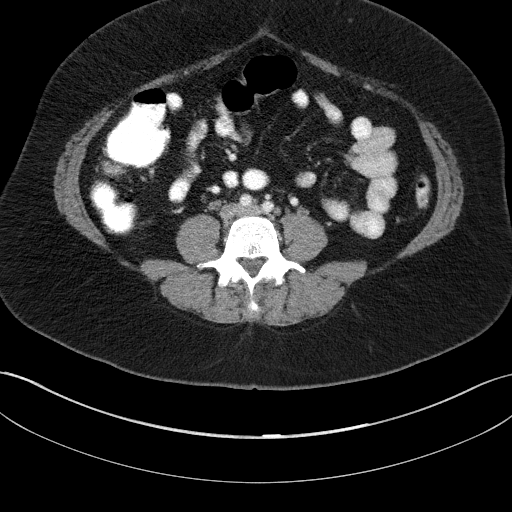
[im 54/99  soft-tissue]
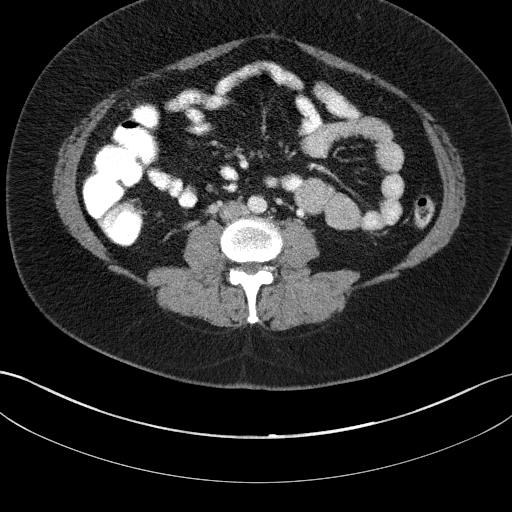
[im 64/99  soft-tissue]
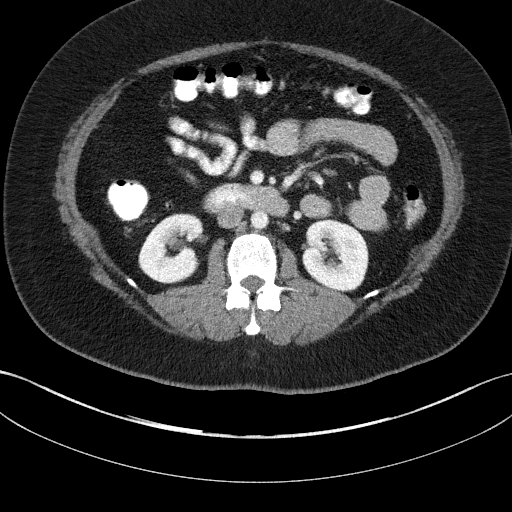
[im 64/99  bone]
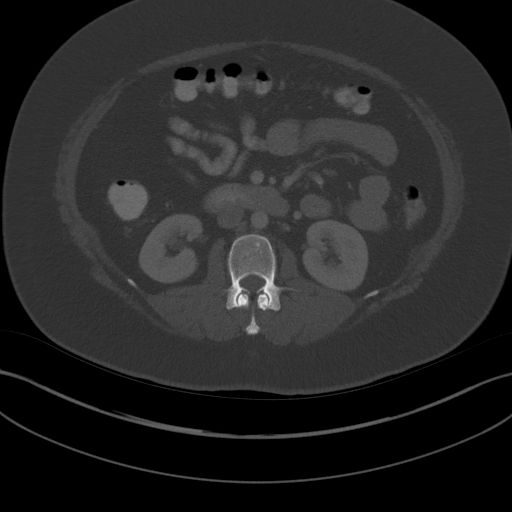
[im 69/99  soft-tissue]
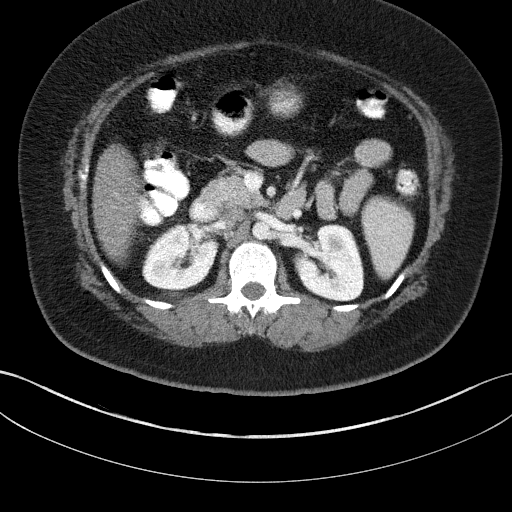
[im 79/99  soft-tissue]
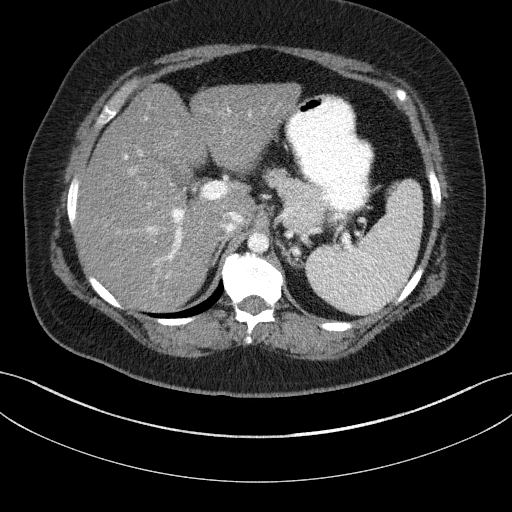
[im 84/99  soft-tissue]
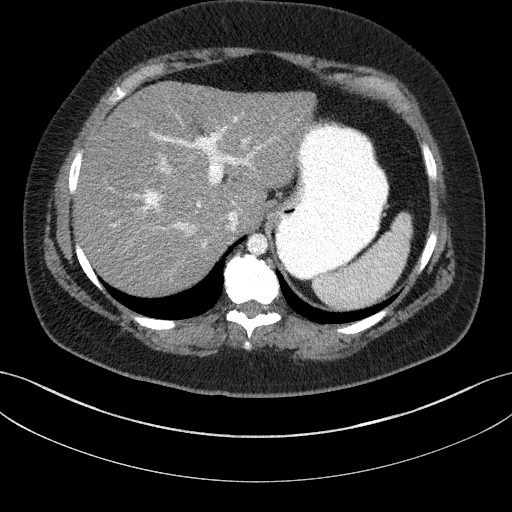
[im 94/99  soft-tissue]
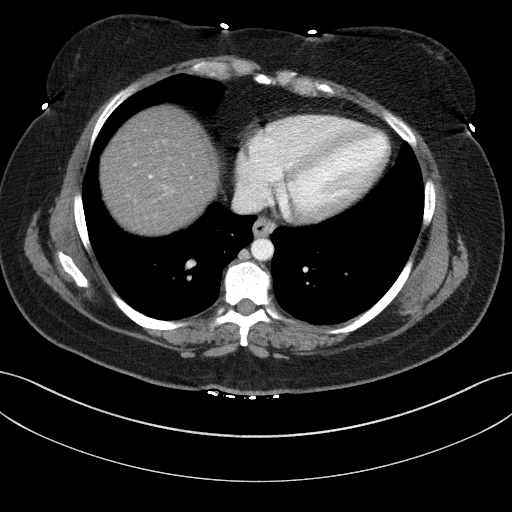

[Series 4: coronal st · coronal · 0.86mm/px · 3 of 109 slices shown]
[im 37/109  soft-tissue]
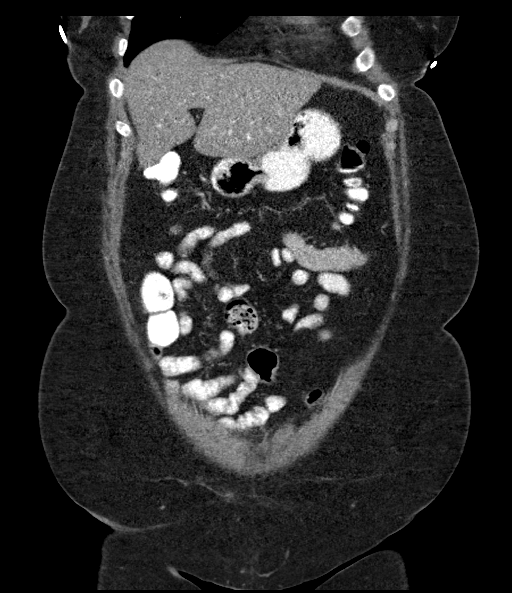
[im 49/109  soft-tissue]
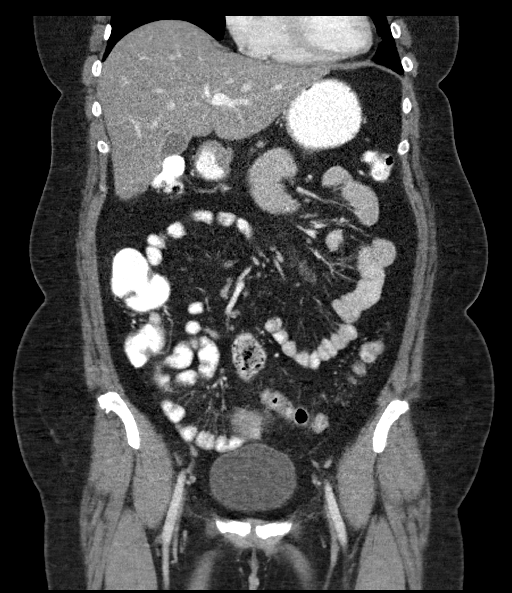
[im 61/109  soft-tissue]
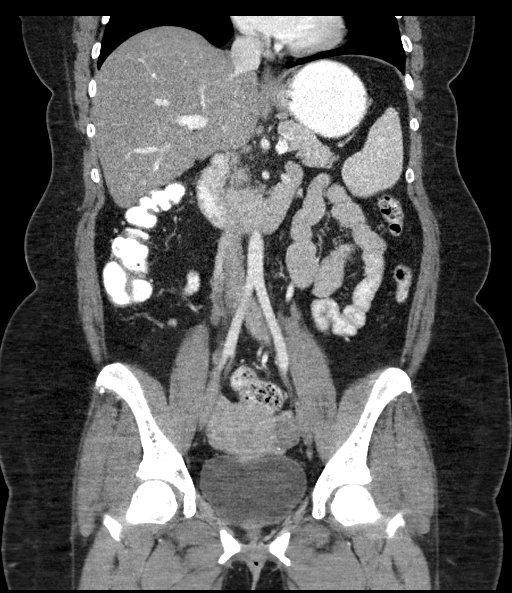

[16 of 46 positions shown; findings below may reference images not displayed]

FINDINGS: The lung bases are clear. The liver is diffusely low in attenuation
suggesting fatty infiltration. No focal hepatic abnormality is seen.
No calcified gallstones are noted. The pancreas is normal in size
and the pancreatic duct is not dilated. The adrenal glands and
spleen are unremarkable. The kidneys enhance with no calculus or
mass. On delayed images, the pelvocaliceal systems are unremarkable
and the proximal ureters are normal in caliber. The abdominal aorta
is normal in caliber.

The urinary bladder is moderately urine distended with no
abnormality noted. The uterus is normal in size. Small
low-attenuation areas in both ovaries are consistent with small
follicles. No free fluid is seen within the pelvis. The terminal
ileum is unremarkable. The appendix is not well seen but no
inflammatory process is noted. A few small lymph nodes are present
in the right lower quadrant of questionable significance. No
abdominal wall hernia is seen. The lumbar vertebrae are in normal
alignment with normal intervertebral disc spaces.
IMPRESSION: 1. Suspect mild fatty infiltration of the liver. Correlate with
LFTs.
2. No inflammatory process is seen. The terminal ileum is
unremarkable. The appendix is not definitely visualized.
3. Probable small ovarian follicles bilaterally. No free fluid is
seen.
4. No renal or ureteral calculi are noted.

## 2016-07-30 ENCOUNTER — Ambulatory Visit (INDEPENDENT_AMBULATORY_CARE_PROVIDER_SITE_OTHER): Payer: BLUE CROSS/BLUE SHIELD | Admitting: Family Medicine

## 2016-07-30 ENCOUNTER — Encounter: Payer: Self-pay | Admitting: Family Medicine

## 2016-07-30 VITALS — BP 124/86 | HR 85 | Temp 98.7°F | Resp 16 | Ht 67.0 in | Wt 215.8 lb

## 2016-07-30 DIAGNOSIS — Z0000 Encounter for general adult medical examination without abnormal findings: Secondary | ICD-10-CM | POA: Diagnosis not present

## 2016-07-30 DIAGNOSIS — Z111 Encounter for screening for respiratory tuberculosis: Secondary | ICD-10-CM

## 2016-07-30 MED ORDER — TUBERCULIN PPD 5 UNIT/0.1ML ID SOLN: 5.0000 [IU] | Freq: Once | INTRADERMAL | Status: DC

## 2016-07-30 NOTE — Progress Notes (Signed)
Pre visit review using our clinic review tool, if applicable. No additional management support is needed unless otherwise documented below in the visit note. Subjective:     Jill Horn is a 41 y.o. female and is here for a comprehensive physical exam. The patient reports no problems.  Needs a tb two step for her job.  Social History   Social History  . Marital status: Married    Spouse name: N/A  . Number of children: N/A  . Years of education: N/A   Occupational History  . Pharm Rep [employer]   Social History Main Topics  . Smoking status: Never Smoker  . Smokeless tobacco: Never Used  . Alcohol use No  . Drug use: No  . Sexual activity: Yes    Birth control/ protection: None   Other Topics Concern  . Not on file   Social History Narrative   No exercise    Lives at home with husband and 2 kids      Health Maintenance  Topic Date Due  . HIV Screening  09/16/1989  . PAP SMEAR  01/06/2019  . TETANUS/TDAP  09/06/2020  . INFLUENZA VACCINE  Completed    The following portions of the patient's history were reviewed and updated as appropriate:  She  has a past medical history of Allergy and Hyperlipidemia. She  does not have any pertinent problems on file. She  has a past surgical history that includes Cesarean section and Carpal tunnel release (07/2006). Her family history includes Lung cancer in her paternal grandfather. She  reports that she has never smoked. She has never used smokeless tobacco. She reports that she does not drink alcohol or use drugs. She has a current medication list which includes the following prescription(s): vitamin d3, loratadine, and metformin. Current Outpatient Prescriptions on File Prior to Visit  Medication Sig Dispense Refill  . loratadine (CLARITIN) 10 MG tablet Take 1 tablet (10 mg total) by mouth daily. 90 tablet 3  . metFORMIN (GLUCOPHAGE) 500 MG tablet Take 1,000 mg by mouth 2 (two) times daily with a meal.     No current  facility-administered medications on file prior to visit.    She is allergic to amoxicillin and prednisone..   Tuberculosis Risk Questionnaire  1. No Were you born outside the [place] in one of the following parts of the world: [place], [place], [place], [place] or [place]?    2. No Have you traveled outside the [place] and lived for more than one month in one of the following parts of the world: [place], [place], [place], [place] or [place]?    3. No Do you have a compromised immune system such as from any of the following conditions:HIV/AIDS, organ or bone marrow transplantation, diabetes, immunosuppressive medicines (e.g. Prednisone, Remicaide), leukemia, lymphoma, cancer of the head or neck, gastrectomy or jejunal bypass, end-stage renal disease (on dialysis), or silicosis?     4. Yes sales medical devices Have you ever or do you plan on working in: a residential care center, a health care facility, a jail or prison or homeless shelter?    5. No Have you ever: injected illegal drugs, used crack cocaine, lived in a homeless shelter  or been in jail or prison?     6. No Have you ever been exposed to anyone with infectious tuberculosis?    Tuberculosis Symptom Questionnaire  Do you currently have any of the following symptoms?  1. No Unexplained cough lasting more than  3 weeks?   2. No Unexplained fever lasting more than 3 weeks.   3. No Night Sweats (sweating that leaves the bedclothes and sheets wet)     4. No Shortness of Breath   5. No Chest Pain   6. No Unintentional weight loss    7. No Unexplained fatigue (very tired for no reason)    Review of Systems Review of Systems  Constitutional: Negative for activity change, appetite change and fatigue.  HENT: Negative for hearing loss, congestion, tinnitus and ear discharge.  dentist q7m Eyes: Negative for visual disturbance (see optho q1y -- vision corrected to 20/20 with glasses).   Respiratory: Negative for cough, chest tightness and shortness of breath.   Cardiovascular: Negative for chest pain, palpitations and leg swelling.  Gastrointestinal: Negative for abdominal pain, diarrhea, constipation and abdominal distention.  Genitourinary: Negative for urgency, frequency, decreased urine volume and difficulty urinating.  Musculoskeletal: Negative for back pain, arthralgias and gait problem.  Skin: Negative for color change, pallor and rash.  Neurological: Negative for dizziness, light-headedness, numbness and headaches.  Hematological: Negative for adenopathy. Does not bruise/bleed easily.  Psychiatric/Behavioral: Negative for suicidal ideas, confusion, sleep disturbance, self-injury, dysphoric mood, decreased concentration and agitation.       Objective:    BP 124/86 (BP Location: Left Arm, Patient Position: Sitting, Cuff Size: Normal)   Pulse 85   Temp 98.7 F (37.1 C) (Oral)   Resp 16   Ht 5\' 7"  (1.702 m)   Wt 215 lb 12.8 oz (97.9 kg)   LMP 07/30/2016   SpO2 96%   BMI 33.80 kg/m  General appearance: alert, cooperative, appears stated age and no distress Head: Normocephalic, without obvious abnormality, atraumatic Eyes: conjunctivae/corneas clear. PERRL, EOM's intact. Fundi benign. Ears: normal TM's and external ear canals both ears Nose: Nares normal. Septum midline. Mucosa normal. No drainage or sinus tenderness. Throat: lips, mucosa, and tongue normal; teeth and gums normal Neck: no adenopathy, no carotid bruit, no JVD, supple, symmetrical, trachea midline and thyroid not enlarged, symmetric, no tenderness/mass/nodules Lungs: clear to auscultation bilaterally Heart: S1, S2 normal Abdomen: soft, non-tender; bowel sounds normal; no masses,  no organomegaly Extremities: extremities normal, atraumatic, no cyanosis or edema Pulses: 2+ and symmetric Skin: Skin color, texture, turgor normal. No rashes or lesions Lymph nodes: Cervical, supraclavicular, and  axillary nodes normal. Neurologic: Alert and oriented X 3, normal strength and tone. Normal symmetric reflexes. Normal coordination and gait    Assessment:    Healthy female exam.      Plan:    [facility] UTD CHECK LABS See After Visit Summary for Counseling Recommendations

## 2016-07-30 NOTE — Patient Instructions (Signed)
Please return Monday for TB read before 9am.  We open at [facility] Preventive care refers to lifestyle choices and visits with your health care provider that can promote health and wellness. What does preventive care include?  A yearly physical exam. This is also called an annual well check.  Dental exams once or twice a year.  Routine eye exams. Ask your health care provider how often you should have your eyes checked.  Personal lifestyle choices, including:  Daily care of your teeth and gums.  Regular physical activity.  Eating a healthy diet.  Avoiding tobacco and drug use.  Limiting alcohol use.  Practicing safe sex.  Taking low-dose aspirin daily starting at age 68.  Taking vitamin and mineral supplements as recommended by your health care provider. What happens during an annual well check? The services and screenings done by your health care provider during your annual well check will depend on your age, overall health, lifestyle risk factors, and family history of disease. Counseling  Your health care provider may ask you questions about your:  Alcohol use.  Tobacco use.  Drug use.  Emotional well-being.  Home and relationship well-being.  Sexual activity.  Eating habits.  Work and work Statistician.  Method of birth control.  Menstrual cycle.  Pregnancy history. Screening  You may have the following tests or measurements:  Height, weight, and BMI.  Blood pressure.  Lipid and cholesterol levels. These may be checked every 5 years, or more frequently if you are over 20 years old.  Skin check.  Lung cancer screening. You may have this screening every year starting at age 75 if you have a 30-pack-year history of smoking and currently smoke or have quit within the past 15 years.  Fecal occult blood test (FOBT) of the stool. You may have this test every year starting at age 24.  Flexible sigmoidoscopy or  colonoscopy. You may have a sigmoidoscopy every 5 years or a colonoscopy every 10 years starting at age 16.  Hepatitis C blood test.  Hepatitis B blood test.  Sexually transmitted disease (STD) testing.  Diabetes screening. This is done by checking your blood sugar (glucose) after you have not eaten for a while (fasting). You may have this done every 1-3 years.  Mammogram. This may be done every 1-2 years. Talk to your health care provider about when you should start having regular mammograms. This may depend on whether you have a family history of breast cancer.  BRCA-related cancer screening. This may be done if you have a family history of breast, ovarian, tubal, or peritoneal cancers.  Pelvic exam and Pap test. This may be done every 3 years starting at age 20. Starting at age 13, this may be done every 5 years if you have a Pap test in combination with an HPV test.  Bone density scan. This is done to screen for osteoporosis. You may have this scan if you are at high risk for osteoporosis. Discuss your test results, treatment options, and if necessary, the need for more tests with your health care provider. Vaccines  Your health care provider may recommend certain vaccines, such as:  Influenza vaccine. This is recommended every year.  Tetanus, diphtheria, and acellular pertussis (Tdap, Td) vaccine. You may need a Td booster every 10 years.  Varicella vaccine. You may need this if you have not been vaccinated.  Zoster vaccine. You may need this after age 80.  Measles, mumps, and  rubella (MMR) vaccine. You may need at least one dose of MMR if you were born in 1957 or later. You may also need a second dose.  Pneumococcal 13-valent conjugate (PCV13) vaccine. You may need this if you have certain conditions and were not previously vaccinated.  Pneumococcal polysaccharide (PPSV23) vaccine. You may need one or two doses if you smoke cigarettes or if you have certain  conditions.  Meningococcal vaccine. You may need this if you have certain conditions.  Hepatitis A vaccine. You may need this if you have certain conditions or if you travel or work in places where you may be exposed to hepatitis A.  Hepatitis B vaccine. You may need this if you have certain conditions or if you travel or work in places where you may be exposed to hepatitis B.  Haemophilus influenzae type b (Hib) vaccine. You may need this if you have certain conditions. Talk to your health care provider about which screenings and vaccines you need and how often you need them. This information is not intended to replace advice given to you by your health care provider. Make sure you discuss any questions you have with your health care provider. Document Released: 06/20/2015 Document Revised: 02/11/2016 Document Reviewed: 03/25/2015 Elsevier Interactive Patient Education  2017 [employer].

## 2016-08-02 ENCOUNTER — Encounter: Payer: Self-pay | Admitting: Behavioral Health

## 2016-08-02 LAB — TB SKIN TEST
Induration: 0 mm
TB Skin Test: NEGATIVE

## 2016-08-06 ENCOUNTER — Ambulatory Visit (INDEPENDENT_AMBULATORY_CARE_PROVIDER_SITE_OTHER): Payer: BLUE CROSS/BLUE SHIELD

## 2016-08-06 DIAGNOSIS — Z111 Encounter for screening for respiratory tuberculosis: Secondary | ICD-10-CM

## 2016-08-06 NOTE — Progress Notes (Signed)
Pre visit review using our clinic tool,if applicable. No additional management support is needed unless otherwise documented below in the visit note.    Patient in for 2nd PPD placement for work. Advised to return by 8:30 a.m. on Monday. Patient agreed

## 2016-08-09 LAB — TB SKIN TEST
Induration: 0 mm
TB SKIN TEST: NEGATIVE

## 2016-09-27 ENCOUNTER — Other Ambulatory Visit: Payer: Self-pay | Admitting: Family Medicine

## 2016-09-27 ENCOUNTER — Telehealth: Payer: Self-pay | Admitting: Family Medicine

## 2016-09-27 DIAGNOSIS — N6459 Other signs and symptoms in breast: Secondary | ICD-10-CM

## 2016-09-27 NOTE — Telephone Encounter (Signed)
Caller name: Zigmund Daniel with [facility]  Reason for call: please have MD cosign epic orders for diagnostic mammogram and right side breast US for pt to have completed at [facility].

## 2016-09-30 ENCOUNTER — Telehealth: Payer: Self-pay | Admitting: Family Medicine

## 2016-09-30 NOTE — Telephone Encounter (Signed)
Please refax the orders to [facility] @ [phone]

## 2016-09-30 NOTE — Telephone Encounter (Signed)
Looks they were signed in epic already.  Lmom for Zigmund Daniel to call back if they still had any problems.

## 2016-09-30 NOTE — Telephone Encounter (Signed)
I did not see order-- was it signed?

## 2016-10-01 NOTE — Telephone Encounter (Signed)
[facility] returning call, orders placed in epic please sign best # [phone]

## 2016-10-04 NOTE — Telephone Encounter (Signed)
done

## 2017-02-14 DIAGNOSIS — N921 Excessive and frequent menstruation with irregular cycle: Secondary | ICD-10-CM | POA: Diagnosis not present

## 2017-03-21 DIAGNOSIS — Z1231 Encounter for screening mammogram for malignant neoplasm of breast: Secondary | ICD-10-CM | POA: Diagnosis not present

## 2017-03-21 DIAGNOSIS — Z01419 Encounter for gynecological examination (general) (routine) without abnormal findings: Secondary | ICD-10-CM | POA: Diagnosis not present

## 2017-03-21 DIAGNOSIS — Z6835 Body mass index (BMI) 35.0-35.9, adult: Secondary | ICD-10-CM | POA: Diagnosis not present

## 2017-04-18 ENCOUNTER — Ambulatory Visit (INDEPENDENT_AMBULATORY_CARE_PROVIDER_SITE_OTHER): Payer: BLUE CROSS/BLUE SHIELD

## 2017-04-18 DIAGNOSIS — Z23 Encounter for immunization: Secondary | ICD-10-CM

## 2017-08-25 ENCOUNTER — Telehealth: Payer: Self-pay | Admitting: Family Medicine

## 2017-08-25 NOTE — Telephone Encounter (Signed)
Have you seen anything?

## 2017-08-25 NOTE — Telephone Encounter (Signed)
Copied from [place] [id number]. Topic: Quick Communication - See Telephone Encounter >> Aug 18, 2017 11:40 AM Percell Belt A wrote: CRM for notification. See Telephone encounter for:  [facility] Remo Lipps)  [phone] called and stated that they faxed over a form for Dr Etter Sjogren to fill out.  This some type of order to get Genetic medical testing??      08/18/17. >> Aug 25, 2017 11:37 AM Bea Graff, NT wrote: [facility] calling to check status on this form being completed.

## 2017-08-26 NOTE — Telephone Encounter (Signed)
Negative, I have not received paperwork, but there is still some up front/SLS 03/22

## 2017-08-29 ENCOUNTER — Encounter: Payer: Self-pay | Admitting: Family Medicine

## 2017-08-29 ENCOUNTER — Telehealth: Payer: Self-pay | Admitting: *Deleted

## 2017-08-29 NOTE — Telephone Encounter (Signed)
Copied from [place]. Topic: General - Other >> Aug 25, 2017 10:57 AM Neva Seat wrote: Pt needing lab orders and lab appt to have [place] done for her work.

## 2017-08-29 NOTE — Telephone Encounter (Signed)
[facility] will refax paperwork.

## 2017-08-30 NOTE — Telephone Encounter (Signed)
See my chart message

## 2017-09-01 ENCOUNTER — Telehealth: Payer: Self-pay | Admitting: *Deleted

## 2017-09-01 NOTE — Telephone Encounter (Signed)
Error-Duplicate/SLS 65/03

## 2017-09-01 NOTE — Telephone Encounter (Signed)
Paperwork from [employer] for Provider Authorization in order for pt to haveDNA test for [facility]; forwarded to provider/SLS 03/28

## 2017-09-05 NOTE — Telephone Encounter (Signed)
Paperwork faxed [phone] 04/01

## 2018-02-11 DIAGNOSIS — H10022 Other mucopurulent conjunctivitis, left eye: Secondary | ICD-10-CM | POA: Diagnosis not present

## 2018-04-07 ENCOUNTER — Ambulatory Visit (INDEPENDENT_AMBULATORY_CARE_PROVIDER_SITE_OTHER): Payer: BLUE CROSS/BLUE SHIELD

## 2018-04-07 DIAGNOSIS — Z23 Encounter for immunization: Secondary | ICD-10-CM

## 2018-04-19 DIAGNOSIS — Z1231 Encounter for screening mammogram for malignant neoplasm of breast: Secondary | ICD-10-CM | POA: Diagnosis not present

## 2018-04-19 DIAGNOSIS — Z6835 Body mass index (BMI) 35.0-35.9, adult: Secondary | ICD-10-CM | POA: Diagnosis not present

## 2018-04-19 DIAGNOSIS — Z01419 Encounter for gynecological examination (general) (routine) without abnormal findings: Secondary | ICD-10-CM | POA: Diagnosis not present

## 2018-04-28 ENCOUNTER — Ambulatory Visit (INDEPENDENT_AMBULATORY_CARE_PROVIDER_SITE_OTHER): Payer: BLUE CROSS/BLUE SHIELD | Admitting: Family Medicine

## 2018-04-28 ENCOUNTER — Encounter: Payer: Self-pay | Admitting: Family Medicine

## 2018-04-28 VITALS — BP 130/78 | HR 93 | Temp 98.2°F | Resp 18 | Wt 219.0 lb

## 2018-04-28 DIAGNOSIS — M778 Other enthesopathies, not elsewhere classified: Secondary | ICD-10-CM

## 2018-04-28 DIAGNOSIS — T7840XD Allergy, unspecified, subsequent encounter: Secondary | ICD-10-CM

## 2018-04-28 MED ORDER — MONTELUKAST SODIUM 10 MG PO TABS
10.0000 mg | ORAL_TABLET | Freq: Every day | ORAL | 3 refills | Status: DC
Start: 2018-04-28 — End: 2021-08-17

## 2018-04-28 MED ORDER — CELECOXIB 200 MG PO CAPS: 200.0000 mg | ORAL_CAPSULE | Freq: Every day | ORAL | 2 refills | Status: DC

## 2018-04-28 NOTE — Patient Instructions (Signed)
Tennis Elbow Tennis elbow (lateral epicondylitis) is inflammation of the outer tendons of your forearm close to your elbow. Your tendons attach your muscles to your bones. The outer tendons of your forearm are used to extend your wrist, and they attach on the outside part of your elbow. Tennis elbow is often found in people who play tennis, but anyone may get the condition from repeatedly extending the wrist or turning the forearm. What are the causes? This condition is caused by repeatedly extending your wrist and using your hands. It can result from sports or work that requires repetitive forearm movements. Tennis elbow may also be caused by an injury. What increases the risk? You have a higher risk of developing tennis elbow if you play tennis or another racquet sport. You also have a higher risk if you frequently use your hands for work. This condition is also more likely to develop in:  Musicians.  Carpenters, painters, and plumbers.  Cooks.  Cashiers.  People who work in factories.  Construction workers.  Butchers.  People who use computers.  What are the signs or symptoms? Symptoms of this condition include:  Pain and tenderness in your forearm and the outer part of your elbow. You may only feel the pain when you use your arm, or you may feel it even when you are not using your arm.  A burning feeling that runs from your elbow through your arm.  Weak grip in your hands.  How is this diagnosed? This condition may be diagnosed by medical history and physical exam. You may also have other tests, including:  X-rays.  MRI.  How is this treated? Your health care provider will recommend lifestyle adjustments, such as resting and icing your arm. Treatment may also include:  Medicines for inflammation. This may include shots of cortisone if your pain continues.  Physical therapy. This may include massage or exercises.  An elbow brace.  Surgery may eventually be  recommended if your pain does not go away with treatment. Follow these instructions at home: Activity  Rest your elbow and wrist as directed by your health care provider. Try to avoid any activities that caused the problem until your health care provider says that you can do them again.  If a physical therapist teaches you exercises, do all of them as directed.  If you lift an object, lift it with your palm facing upward. This lowers the stress on your elbow. Lifestyle  If your tennis elbow is caused by sports, check your equipment and make sure that: ? You are using it correctly. ? It is the best fit for you.  If your tennis elbow is caused by work, take breaks frequently, if you are able. Talk with your manager about how to best perform tasks in a way that is safe. ? If your tennis elbow is caused by computer use, talk with your manager about any changes that can be made to your work environment. General instructions  If directed, apply ice to the painful area: ? Put ice in a plastic bag. ? Place a towel between your skin and the bag. ? Leave the ice on for 20 minutes, 2-3 times per day.  Take medicines only as directed by your health care provider.  If you were given a brace, wear it as directed by your health care provider.  Keep all follow-up visits as directed by your health care provider. This is important. Contact a health care provider if:  Your pain does not   get better with treatment.  Your pain gets worse.  You have numbness or weakness in your forearm, hand, or fingers. This information is not intended to replace advice given to you by your health care provider. Make sure you discuss any questions you have with your health care provider. Document Released: 05/24/2005 Document Revised: 01/22/2016 Document Reviewed: 05/20/2014 Elsevier Interactive Patient Education  2018 Elsevier Inc.  

## 2018-04-28 NOTE — Progress Notes (Signed)
Patient ID: Jill Horn, female    DOB: 1975-11-01  Age: 42 y.o. MRN: [id number]    Subjective:  Subjective  HPI Jill Horn presents for r shoulder pain x few weeks.   No known injury but pt carries heavy bags for work.    Review of Systems  Constitutional: Negative for chills and fever.  HENT: Negative for congestion and hearing loss.   Eyes: Negative for discharge.  Respiratory: Negative for cough and shortness of breath.   Cardiovascular: Negative for chest pain, palpitations and leg swelling.  Gastrointestinal: Negative for abdominal pain, blood in stool, constipation, diarrhea, nausea and vomiting.  Genitourinary: Negative for dysuria, frequency, hematuria and urgency.  Musculoskeletal: Positive for arthralgias. Negative for back pain and myalgias.  Skin: Negative for rash.  Allergic/Immunologic: Negative for environmental allergies.  Neurological: Negative for dizziness, weakness and headaches.  Hematological: Does not bruise/bleed easily.  Psychiatric/Behavioral: Negative for suicidal ideas. The patient is not nervous/anxious.     History Past Medical History:  Diagnosis Date  . Allergy   . Hyperlipidemia     She has a past surgical history that includes Cesarean section and Carpal tunnel release (07/2006).   Her family history includes Hyperlipidemia in her unknown relative; Hypertension in her unknown relative; Lung cancer in her paternal grandfather.She reports that she has never smoked. She has never used smokeless tobacco. She reports that she does not drink alcohol or use drugs.  Current Outpatient Medications on File Prior to Visit  Medication Sig Dispense Refill  . Cholecalciferol (VITAMIN D3) 50000 units CAPS Take 1 capsule by mouth daily.    Marland Kitchen loratadine (CLARITIN) 10 MG tablet Take 1 tablet (10 mg total) by mouth daily. 90 tablet 3  . metFORMIN (GLUCOPHAGE) 500 MG tablet Take 1,000 mg by mouth 2 (two) times daily with a meal.     No current  facility-administered medications on file prior to visit.      Objective:  Objective  Physical Exam  Constitutional: She is oriented to person, place, and time. She appears well-developed and well-nourished.  HENT:  Head: Normocephalic and atraumatic.  Eyes: Conjunctivae and EOM are normal.  Neck: Normal range of motion. Neck supple. No JVD present. Carotid bruit is not present. No thyromegaly present.  Cardiovascular: Normal rate, regular rhythm and normal heart sounds.  No murmur heard. Pulmonary/Chest: Effort normal and breath sounds normal. No respiratory distress. She has no wheezes. She has no rales. She exhibits no tenderness.  Musculoskeletal: She exhibits tenderness. She exhibits no edema.       Right shoulder: She exhibits decreased range of motion, tenderness, pain, spasm and decreased strength.  Neurological: She is alert and oriented to person, place, and time.  Psychiatric: She has a normal mood and affect.  Nursing note and vitals reviewed.  BP 130/78   Pulse 93   Temp 98.2 F (36.8 C) (Oral)   Resp 18   Wt 219 lb (99.3 kg)   SpO2 96%   BMI 34.30 kg/m  Wt Readings from Last 3 Encounters:  04/28/18 219 lb (99.3 kg)  07/30/16 215 lb 12.8 oz (97.9 kg)  06/02/16 213 lb 3.2 oz (96.7 kg)     Lab Results  Component Value Date   WBC 8.4 11/25/2015   HGB 14.6 11/25/2015   HCT 43.9 11/25/2015   PLT 224.0 11/25/2015   GLUCOSE 92 11/25/2015   CHOL 216 (H) 11/25/2015   TRIG 159.0 (H) 11/25/2015   HDL 52.20 11/25/2015   LDLDIRECT 155.5  06/22/2013   LDLCALC 132 (H) 11/25/2015   ALT 94 (H) 11/25/2015   AST 60 (H) 11/25/2015   NA 138 11/25/2015   K 3.8 11/25/2015   CL 104 11/25/2015   CREATININE 0.66 11/25/2015   BUN 11 11/25/2015   CO2 27 11/25/2015   TSH 1.30 11/25/2015    Ct Abdomen Pelvis W Contrast  Result Date: 11/25/2015 CLINICAL DATA:  Left periumbilical abdominal pain and left low back pain for 6 days, elevated liver function tests EXAM: CT ABDOMEN  AND PELVIS WITH CONTRAST TECHNIQUE: Multidetector CT imaging of the abdomen and pelvis was performed using the standard protocol following bolus administration of intravenous contrast. CONTRAST:  157mL ISOVUE-300 IOPAMIDOL (ISOVUE-300) INJECTION 61% COMPARISON:  Ultrasound of the abdomen of 09/23/2010 FINDINGS: The lung bases are clear. The liver is diffusely low in attenuation suggesting fatty infiltration. No focal hepatic abnormality is seen. No calcified gallstones are noted. The pancreas is normal in size and the pancreatic duct is not dilated. The adrenal glands and spleen are unremarkable. The kidneys enhance with no calculus or mass. On delayed images, the pelvocaliceal systems are unremarkable and the proximal ureters are normal in caliber. The abdominal aorta is normal in caliber. The urinary bladder is moderately urine distended with no abnormality noted. The uterus is normal in size. Small low-attenuation areas in both ovaries are consistent with small follicles. No free fluid is seen within the pelvis. The terminal ileum is unremarkable. The appendix is not well seen but no inflammatory process is noted. A few small lymph nodes are present in the right lower quadrant of questionable significance. No abdominal wall hernia is seen. The lumbar vertebrae are in normal alignment with normal intervertebral disc spaces. IMPRESSION: 1. Suspect mild fatty infiltration of the liver. Correlate with LFTs. 2. No inflammatory process is seen. The terminal ileum is unremarkable. The appendix is not definitely visualized. 3. Probable small ovarian follicles bilaterally. No free fluid is seen. 4. No renal or ureteral calculi are noted. Electronically Signed   By: Ivar Drape M.D.   On: 11/25/2015 12:20     Assessment & Plan:  Plan  I am having Ezri A. Furness start on montelukast and celecoxib. I am also having her maintain her loratadine, metFORMIN, and Vitamin D3.  Meds ordered this encounter  Medications  .  montelukast (SINGULAIR) 10 MG tablet    Sig: Take 1 tablet (10 mg total) by mouth at bedtime.    Dispense:  30 tablet    Refill:  3  . celecoxib (CELEBREX) 200 MG capsule    Sig: Take 1 capsule (200 mg total) by mouth daily.    Dispense:  30 capsule    Refill:  2    Problem List Items Addressed This Visit    None    Visit Diagnoses    Allergic state, subsequent encounter    -  Primary   Relevant Medications   montelukast (SINGULAIR) 10 MG tablet   Tendonitis of elbow, right       Relevant Medications   celecoxib (CELEBREX) 200 MG capsule    pt is allergic to prednisone Refer to sport med if no better  Follow-up: Return if symptoms worsen or fail to improve.  Ann Held, DO

## 2019-01-23 DIAGNOSIS — H00024 Hordeolum internum left upper eyelid: Secondary | ICD-10-CM | POA: Diagnosis not present

## 2019-04-23 DIAGNOSIS — Z6836 Body mass index (BMI) 36.0-36.9, adult: Secondary | ICD-10-CM | POA: Diagnosis not present

## 2019-04-23 DIAGNOSIS — Z23 Encounter for immunization: Secondary | ICD-10-CM | POA: Diagnosis not present

## 2019-04-23 DIAGNOSIS — E282 Polycystic ovarian syndrome: Secondary | ICD-10-CM | POA: Diagnosis not present

## 2019-04-23 DIAGNOSIS — E559 Vitamin D deficiency, unspecified: Secondary | ICD-10-CM | POA: Diagnosis not present

## 2019-04-23 DIAGNOSIS — Z1231 Encounter for screening mammogram for malignant neoplasm of breast: Secondary | ICD-10-CM | POA: Diagnosis not present

## 2019-04-23 DIAGNOSIS — Z01419 Encounter for gynecological examination (general) (routine) without abnormal findings: Secondary | ICD-10-CM | POA: Diagnosis not present

## 2020-01-09 ENCOUNTER — Encounter: Payer: Self-pay | Admitting: Family Medicine

## 2020-01-10 DIAGNOSIS — Z20822 Contact with and (suspected) exposure to covid-19: Secondary | ICD-10-CM | POA: Diagnosis not present

## 2020-07-07 DIAGNOSIS — Z01419 Encounter for gynecological examination (general) (routine) without abnormal findings: Secondary | ICD-10-CM | POA: Diagnosis not present

## 2020-07-07 DIAGNOSIS — Z1231 Encounter for screening mammogram for malignant neoplasm of breast: Secondary | ICD-10-CM | POA: Diagnosis not present

## 2020-07-07 DIAGNOSIS — Z6836 Body mass index (BMI) 36.0-36.9, adult: Secondary | ICD-10-CM | POA: Diagnosis not present

## 2021-04-16 ENCOUNTER — Encounter: Payer: Self-pay | Admitting: Family Medicine

## 2021-04-16 ENCOUNTER — Other Ambulatory Visit: Payer: Self-pay | Admitting: Family Medicine

## 2021-04-16 DIAGNOSIS — Z20828 Contact with and (suspected) exposure to other viral communicable diseases: Secondary | ICD-10-CM

## 2021-04-16 MED ORDER — OSELTAMIVIR PHOSPHATE 75 MG PO CAPS: 75.0000 mg | ORAL_CAPSULE | Freq: Every day | ORAL | 0 refills | Status: DC

## 2021-07-24 ENCOUNTER — Encounter: Payer: Self-pay | Admitting: Family Medicine

## 2021-07-29 NOTE — Telephone Encounter (Signed)
Called patient to get schedule. Patient mail box full and could not leave message.  Sent mychart for her to call us

## 2021-08-13 DIAGNOSIS — Z6836 Body mass index (BMI) 36.0-36.9, adult: Secondary | ICD-10-CM | POA: Diagnosis not present

## 2021-08-13 DIAGNOSIS — Z01419 Encounter for gynecological examination (general) (routine) without abnormal findings: Secondary | ICD-10-CM | POA: Diagnosis not present

## 2021-08-13 DIAGNOSIS — Z1231 Encounter for screening mammogram for malignant neoplasm of breast: Secondary | ICD-10-CM | POA: Diagnosis not present

## 2021-08-17 ENCOUNTER — Encounter: Payer: Self-pay | Admitting: Family Medicine

## 2021-08-17 ENCOUNTER — Ambulatory Visit (INDEPENDENT_AMBULATORY_CARE_PROVIDER_SITE_OTHER): Payer: BC Managed Care – PPO | Admitting: Family Medicine

## 2021-08-17 VITALS — BP 110/80 | HR 94 | Temp 98.0°F | Resp 18 | Ht 67.0 in | Wt 226.8 lb

## 2021-08-17 DIAGNOSIS — Z0000 Encounter for general adult medical examination without abnormal findings: Secondary | ICD-10-CM | POA: Diagnosis not present

## 2021-08-17 DIAGNOSIS — Z1211 Encounter for screening for malignant neoplasm of colon: Secondary | ICD-10-CM

## 2021-08-17 DIAGNOSIS — Z23 Encounter for immunization: Secondary | ICD-10-CM

## 2021-08-17 DIAGNOSIS — E282 Polycystic ovarian syndrome: Secondary | ICD-10-CM

## 2021-08-17 DIAGNOSIS — E669 Obesity, unspecified: Secondary | ICD-10-CM

## 2021-08-17 DIAGNOSIS — Z1159 Encounter for screening for other viral diseases: Secondary | ICD-10-CM

## 2021-08-17 DIAGNOSIS — E785 Hyperlipidemia, unspecified: Secondary | ICD-10-CM

## 2021-08-17 MED ORDER — FREESTYLE LIBRE 3 SENSOR MISC: 1.0000 [IU] | 3 refills | Status: DC

## 2021-08-17 MED ORDER — WEGOVY 0.5 MG/0.5ML ~~LOC~~ SOAJ: 0.5000 mg | SUBCUTANEOUS | 2 refills | Status: DC

## 2021-08-17 NOTE — Patient Instructions (Signed)

## 2021-08-17 NOTE — Progress Notes (Signed)
? ?Subjective:  ? ?By signing my name below, I, Carylon Perches, attest that this documentation has been prepared under the direction and in the presence of Roma Schanz DO, 08/17/2021 ? ? Patient ID: Jill Horn, female    DOB: 1975-06-10, 46 y.o.   MRN: [id number] ? ?No chief complaint on file. ? ? ?HPI ?Patient is in today for a comprehensive physical exam.  ? ?Patient requests 0.25 MG of Wegovy. She also requests the [place] system. ? ?She denies having any fever, ear pain, new muscle pain, joint pain, new moles, congestion, sinus pain, sore throat, palpations, wheezing, n/v/d, constipation, blood in stool, dysuria, frequency, hematuria at this time. ? ?She reports no change in family and social history. ?She does not want to receive the flu vaccination. She is interested in receiving the tetanus vaccination. ?To help maintain weight, she has avoided consuming certain foods that are high in carbohydrates. She also has been drinking more water and decreasing her intake of sodas.  ? ? ? ?Past Medical History:  ?Diagnosis Date  ? Allergy   ? Hyperlipidemia   ? ? ?Past Surgical History:  ?Procedure Laterality Date  ? CARPAL TUNNEL RELEASE  07/2006  ? CESAREAN SECTION    ? x2  ? ? ?Family History  ?Problem Relation Age of Onset  ? Hyperlipidemia Unknown   ? Hypertension Unknown   ? Lung cancer Paternal Grandfather   ? ? ?Social History  ? ?Socioeconomic History  ? Marital status: Married  ?  Spouse name: Not on file  ? Number of children: Not on file  ? Years of education: Not on file  ? Highest education level: Not on file  ?Occupational History  ? Occupation: Pharm Rep  ?  Employer: [employer]  ?Tobacco Use  ? Smoking status: Never  ? Smokeless tobacco: Never  ?Substance and Sexual Activity  ? Alcohol use: No  ? Drug use: No  ? Sexual activity: Yes  ?  Birth control/protection: None  ?Other Topics Concern  ? Not on file  ?Social History Narrative  ? No exercise   ? Lives at home with husband and 2 kids  ? ?Social  Determinants of Health  ? ?Financial Resource Strain: Not on file  ?Food Insecurity: Not on file  ?Transportation Needs: Not on file  ?Physical Activity: Not on file  ?Stress: Not on file  ?Social Connections: Not on file  ?Intimate Partner Violence: Not on file  ? ? ?Outpatient Medications Prior to Visit  ?Medication Sig Dispense Refill  ? celecoxib (CELEBREX) 200 MG capsule Take 1 capsule (200 mg total) by mouth daily. 30 capsule 2  ? Cholecalciferol (VITAMIN D3) 50000 units CAPS Take 1 capsule by mouth daily.    ? loratadine (CLARITIN) 10 MG tablet Take 1 tablet (10 mg total) by mouth daily. 90 tablet 3  ? metFORMIN (GLUCOPHAGE) 500 MG tablet Take 1,000 mg by mouth 2 (two) times daily with a meal.    ? montelukast (SINGULAIR) 10 MG tablet Take 1 tablet (10 mg total) by mouth at bedtime. 30 tablet 3  ? oseltamivir (TAMIFLU) 75 MG capsule Take 1 capsule (75 mg total) by mouth daily. 10 capsule 0  ? ?No facility-administered medications prior to visit.  ? ? ?Allergies  ?Allergen Reactions  ? Amoxicillin   ?  REACTION: rash -whelps  ? Prednisone   ?  REACTION: anaphylactic  ? ? ?Review of Systems  ?Constitutional:  Negative for fever.  ?HENT:  Negative for congestion, ear pain,  sinus pain and sore throat.   ?Respiratory:  Negative for wheezing.   ?Cardiovascular:  Negative for palpitations.  ?Gastrointestinal:  Negative for constipation, diarrhea, nausea and vomiting.  ?Genitourinary:  Negative for dysuria, frequency and hematuria.  ?Musculoskeletal:  Negative for joint pain and myalgias.  ?Skin:   ?     (-) New moles  ? ?   ?Objective:  ?  ?Physical Exam ?Constitutional:   ?   General: She is not in acute distress. ?   Appearance: Normal appearance. She is not ill-appearing.  ?HENT:  ?   Head: Normocephalic and atraumatic.  ?   Right Ear: Tympanic membrane, ear canal and external ear normal.  ?   Left Ear: Tympanic membrane, ear canal and external ear normal.  ?Eyes:  ?   Extraocular Movements: Extraocular movements  intact.  ?   Pupils: Pupils are equal, round, and reactive to light.  ?Cardiovascular:  ?   Rate and Rhythm: Normal rate and regular rhythm.  ?   Heart sounds: Normal heart sounds. No murmur heard. ?  No gallop.  ?Pulmonary:  ?   Effort: Pulmonary effort is normal. No respiratory distress.  ?   Breath sounds: Normal breath sounds. No wheezing or rales.  ?Abdominal:  ?   General: Bowel sounds are normal. There is no distension.  ?   Palpations: Abdomen is soft.  ?   Tenderness: There is no abdominal tenderness. There is no guarding.  ?Skin: ?   General: Skin is warm and dry.  ?Neurological:  ?   Mental Status: She is alert and oriented to person, place, and time.  ?Psychiatric:     ?   Judgment: Judgment normal.  ? ? ?There were no vitals taken for this visit. ?Wt Readings from Last 3 Encounters:  ?04/28/18 219 lb (99.3 kg)  ?07/30/16 215 lb 12.8 oz (97.9 kg)  ?06/02/16 213 lb 3.2 oz (96.7 kg)  ? ? ?Diabetic Foot Exam - Simple   ?No data filed ?  ? ?Lab Results  ?Component Value Date  ? WBC 8.4 11/25/2015  ? HGB 14.6 11/25/2015  ? HCT 43.9 11/25/2015  ? PLT 224.0 11/25/2015  ? GLUCOSE 92 11/25/2015  ? CHOL 216 (H) 11/25/2015  ? TRIG 159.0 (H) 11/25/2015  ? HDL 52.20 11/25/2015  ? LDLDIRECT 155.5 06/22/2013  ? LDLCALC 132 (H) 11/25/2015  ? ALT 94 (H) 11/25/2015  ? AST 60 (H) 11/25/2015  ? NA 138 11/25/2015  ? K 3.8 11/25/2015  ? CL 104 11/25/2015  ? CREATININE 0.66 11/25/2015  ? BUN 11 11/25/2015  ? CO2 27 11/25/2015  ? TSH 1.30 11/25/2015  ? ? ?Lab Results  ?Component Value Date  ? TSH 1.30 11/25/2015  ? ?Lab Results  ?Component Value Date  ? WBC 8.4 11/25/2015  ? HGB 14.6 11/25/2015  ? HCT 43.9 11/25/2015  ? MCV 88.5 11/25/2015  ? PLT 224.0 11/25/2015  ? ?Lab Results  ?Component Value Date  ? NA 138 11/25/2015  ? K 3.8 11/25/2015  ? CO2 27 11/25/2015  ? GLUCOSE 92 11/25/2015  ? BUN 11 11/25/2015  ? CREATININE 0.66 11/25/2015  ? BILITOT 0.6 11/25/2015  ? ALKPHOS 69 11/25/2015  ? AST 60 (H) 11/25/2015  ? ALT 94 (H)  11/25/2015  ? PROT 7.0 11/25/2015  ? ALBUMIN 4.2 11/25/2015  ? CALCIUM 9.3 11/25/2015  ? GFR 104.80 11/25/2015  ? ?Lab Results  ?Component Value Date  ? CHOL 216 (H) 11/25/2015  ? ?Lab Results  ?Component  Value Date  ? HDL 52.20 11/25/2015  ? ?Lab Results  ?Component Value Date  ? LDLCALC 132 (H) 11/25/2015  ? ?Lab Results  ?Component Value Date  ? TRIG 159.0 (H) 11/25/2015  ? ?Lab Results  ?Component Value Date  ? CHOLHDL 4 11/25/2015  ? ?No results found for: HGBA1C ? ?Pap Smear: Last completed 01/06/2016 ?Mammogram: Last completed 10/15/2015 ?   ?Assessment & Plan:  ? ?Problem List Items Addressed This Visit   ?None ? ? ? ? ?No orders of the defined types were placed in this encounter. ? ? ?I, Carylon Perches, personally preformed the services described in this documentation.  All medical record entries made by the scribe were at my direction and in my presence.  I have reviewed the chart and discharge instructions (if applicable) and agree that the record reflects my personal performance and is accurate and complete. 08/17/2021 ? ? ?I,Amber Collins,acting as a Education administrator for [employer], DO.,have documented all relevant documentation on the behalf of Ann Held, DO,as directed by  Ann Held, DO while in the presence of Ann Held, DO. ? ? ? ?[employer] ? ?

## 2021-08-17 NOTE — Assessment & Plan Note (Signed)
Encourage heart healthy diet such as MIND or DASH diet, increase exercise, avoid trans fats, simple carbohydrates and processed foods, consider a krill or fish or flaxseed oil cap daily.  °

## 2021-08-17 NOTE — Assessment & Plan Note (Signed)
ghm utd Check labs  See avs  

## 2021-08-17 NOTE — Assessment & Plan Note (Signed)
wegovy sample 0.25 m weekly x 4 weeks then inc to 0.5 mg weekly  ?

## 2021-08-18 ENCOUNTER — Other Ambulatory Visit (INDEPENDENT_AMBULATORY_CARE_PROVIDER_SITE_OTHER): Payer: BC Managed Care – PPO

## 2021-08-18 DIAGNOSIS — Z1159 Encounter for screening for other viral diseases: Secondary | ICD-10-CM

## 2021-08-18 DIAGNOSIS — E282 Polycystic ovarian syndrome: Secondary | ICD-10-CM | POA: Diagnosis not present

## 2021-08-18 DIAGNOSIS — Z0000 Encounter for general adult medical examination without abnormal findings: Secondary | ICD-10-CM

## 2021-08-18 LAB — LIPID PANEL
Cholesterol: 201 mg/dL — ABNORMAL HIGH (ref 0–200)
HDL: 49 mg/dL (ref >39.00)
LDL Cholesterol: 128 mg/dL — ABNORMAL HIGH (ref 0–99)
NonHDL: 152.44
Total CHOL/HDL Ratio: 4
Triglycerides: 122 mg/dL (ref 0.0–149.0)
VLDL: 24.4 mg/dL (ref 0.0–40.0)

## 2021-08-18 LAB — COMPREHENSIVE METABOLIC PANEL
ALT: 46 U/L — ABNORMAL HIGH (ref 0–35)
AST: 33 U/L (ref 0–37)
Albumin: 4.3 g/dL (ref 3.5–5.2)
Alkaline Phosphatase: 74 U/L (ref 39–117)
BUN: 13 mg/dL (ref 6–23)
CO2: 28 mEq/L (ref 19–32)
Calcium: 9.1 mg/dL (ref 8.4–10.5)
Chloride: 103 mEq/L (ref 96–112)
Creatinine, Ser: 0.78 mg/dL (ref 0.40–1.20)
GFR: 90.77 mL/min (ref >60.00)
Glucose, Bld: 96 mg/dL (ref 70–99)
Potassium: 4.4 mEq/L (ref 3.5–5.1)
Sodium: 137 mEq/L (ref 135–145)
Total Bilirubin: 0.7 mg/dL (ref 0.2–1.2)
Total Protein: 6.4 g/dL (ref 6.0–8.3)

## 2021-08-18 LAB — CBC WITH DIFFERENTIAL/PLATELET
Basophils Absolute: 0 10*3/uL (ref 0.0–0.1)
Basophils Relative: 0.3 % (ref 0.0–3.0)
Eosinophils Absolute: 0.3 10*3/uL (ref 0.0–0.7)
Eosinophils Relative: 3.3 % (ref 0.0–5.0)
HCT: 43.7 % (ref 36.0–46.0)
Hemoglobin: 14.6 g/dL (ref 12.0–15.0)
Lymphocytes Relative: 30.2 % (ref 12.0–46.0)
Lymphs Abs: 2.4 10*3/uL (ref 0.7–4.0)
MCHC: 33.4 g/dL (ref 30.0–36.0)
MCV: 89.7 fl (ref 78.0–100.0)
Monocytes Absolute: 0.5 10*3/uL (ref 0.1–1.0)
Monocytes Relative: 5.6 % (ref 3.0–12.0)
Neutro Abs: 4.9 10*3/uL (ref 1.4–7.7)
Neutrophils Relative %: 60.6 % (ref 43.0–77.0)
Platelets: 212 10*3/uL (ref 150.0–400.0)
RBC: 4.87 Mil/uL (ref 3.87–5.11)
RDW: 13.4 % (ref 11.5–15.5)
WBC: 8 10*3/uL (ref 4.0–10.5)

## 2021-08-18 LAB — HEMOGLOBIN A1C: Hgb A1c MFr Bld: 6 % (ref 4.6–6.5)

## 2021-08-19 LAB — HEPATITIS C ANTIBODY
Hepatitis C Ab: NONREACTIVE
SIGNAL TO CUT-OFF: <0.02 (ref <1.00)

## 2021-08-19 LAB — INSULIN, RANDOM: Insulin: 29.7 u[IU]/mL — ABNORMAL HIGH

## 2021-10-19 ENCOUNTER — Encounter: Payer: Self-pay | Admitting: Family Medicine

## 2021-10-21 MED ORDER — WEGOVY 1 MG/0.5ML ~~LOC~~ SOAJ: 1.0000 mg | SUBCUTANEOUS | 0 refills | Status: DC

## 2021-12-04 ENCOUNTER — Other Ambulatory Visit: Payer: Self-pay | Admitting: Family Medicine

## 2021-12-06 ENCOUNTER — Other Ambulatory Visit: Payer: Self-pay | Admitting: Family Medicine

## 2021-12-07 ENCOUNTER — Other Ambulatory Visit: Payer: Self-pay | Admitting: Family Medicine

## 2021-12-07 MED ORDER — WEGOVY 1.7 MG/0.75ML ~~LOC~~ SOAJ: 1.7000 mg | SUBCUTANEOUS | 3 refills | Status: DC

## 2021-12-07 NOTE — Telephone Encounter (Signed)
Ok to send in the 1.7?

## 2022-01-07 ENCOUNTER — Encounter: Admission: RE | Payer: Self-pay | Source: Home / Self Care

## 2022-01-07 ENCOUNTER — Ambulatory Visit
Admission: RE | Admit: 2022-01-07 | Payer: BC Managed Care – PPO | Source: Home / Self Care | Admitting: Gastroenterology

## 2022-01-07 SURGERY — COLONOSCOPY
Anesthesia: General

## 2022-02-01 ENCOUNTER — Encounter: Payer: Self-pay | Admitting: Family Medicine

## 2022-02-01 ENCOUNTER — Other Ambulatory Visit: Payer: Self-pay | Admitting: Family Medicine

## 2022-02-01 DIAGNOSIS — U071 COVID-19: Secondary | ICD-10-CM

## 2022-02-01 MED ORDER — MOLNUPIRAVIR EUA 200MG CAPSULE: 4.0000 | ORAL_CAPSULE | Freq: Two times a day (BID) | ORAL | 0 refills | Status: AC

## 2022-02-01 NOTE — Telephone Encounter (Signed)
Pt does not see the point in an appt and would rather pcp send in paxlovid. She stated she is a personal friend of Dr.Lowne and has medical experience.   [id number]- [place], [place] - [address]   [address], [place] [id number] Phone:  [phone] Fax:  [phone]

## 2022-02-01 NOTE — Telephone Encounter (Signed)
See other message. Sent to [employer]

## 2022-04-10 ENCOUNTER — Other Ambulatory Visit: Payer: Self-pay | Admitting: Family Medicine

## 2022-04-12 MED ORDER — WEGOVY 2.4 MG/0.75ML ~~LOC~~ SOAJ: 2.4000 mg | SUBCUTANEOUS | 2 refills | Status: DC

## 2022-04-20 ENCOUNTER — Encounter: Payer: Self-pay | Admitting: Family Medicine

## 2022-04-20 ENCOUNTER — Ambulatory Visit (INDEPENDENT_AMBULATORY_CARE_PROVIDER_SITE_OTHER): Payer: BC Managed Care – PPO | Admitting: Family Medicine

## 2022-04-20 MED ORDER — WEGOVY 2.4 MG/0.75ML ~~LOC~~ SOAJ: 2.4000 mg | SUBCUTANEOUS | 2 refills | Status: DC

## 2022-04-20 NOTE — Assessment & Plan Note (Signed)
Inc wegovy to 2.4 mg weekly   f/u 3 months She has lost 20 lbs Encouraged eating more protein and drinking more water  Some carb and healthy fat is also important

## 2022-04-20 NOTE — Patient Instructions (Signed)
Obesity, Adult Obesity is the condition of having too much total body fat. Being overweight or obese means that your weight is greater than what is considered healthy for your body size. Obesity is determined by a measurement called BMI (body mass index). BMI is an estimate of body fat and is calculated from height and weight. For adults, a BMI of 30 or higher is considered obese. Obesity can lead to other health concerns and major illnesses, including: Stroke. Coronary artery disease (CAD). Type 2 diabetes. Some types of cancer, including cancers of the colon, breast, uterus, and gallbladder. High blood pressure (hypertension). High cholesterol. Gallbladder stones. Obesity can also contribute to: Osteoarthritis. Sleep apnea. Infertility problems. What are the causes? Common causes of this condition include: Eating daily meals that are high in calories, sugar, and fat. Drinking high amounts of sugar-sweetened beverages, such as soft drinks. Being born with genes that may make you more likely to become obese. Having a medical condition that causes obesity, including: Hypothyroidism. Polycystic ovarian syndrome (PCOS). Binge-eating disorder. Cushing syndrome. Taking certain medicines, such as steroids, antidepressants, and seizure medicines. Not being physically active (sedentary lifestyle). Not getting enough sleep. What increases the risk? The following factors may make you more likely to develop this condition: Having a family history of obesity. Living in an area with limited access to: Parks, recreation centers, or sidewalks. Healthy food choices, such as grocery stores and farmers' markets. What are the signs or symptoms? The main sign of this condition is having too much body fat. How is this diagnosed? This condition is diagnosed based on: Your BMI. If you are an adult with a BMI of 30 or higher, you are considered obese. Your waist circumference. This measures the  distance around your waistline. Your skinfold thickness. Your health care provider may gently pinch a fold of your skin and measure it. You may have other tests to check for underlying conditions. How is this treated? Treatment for this condition often includes changing your lifestyle. Treatment may include some or all of the following: Dietary changes. This may include developing a healthy meal plan. Regular physical activity. This may include activity that causes your heart to beat faster (aerobic exercise) and strength training. Work with your health care provider to design an exercise program that works for you. Medicine to help you lose weight if you are unable to lose one pound a week after six weeks of healthy eating and more physical activity. Treating conditions that cause the obesity (underlying conditions). Surgery. Surgical options may include gastric banding and gastric bypass. Surgery may be done if: Other treatments have not helped to improve your condition. You have a BMI of 40 or higher. You have life-threatening health problems related to obesity. Follow these instructions at home: Eating and drinking  Follow recommendations from your health care provider about what you eat and drink. Your health care provider may advise you to: Limit fast food, sweets, and processed snack foods. Choose low-fat options, such as low-fat milk instead of whole milk. Eat five or more servings of fruits or vegetables every day. Choose healthy foods when you eat out. Keep low-fat snacks available. Limit sugary drinks, such as soda, fruit juice, sweetened iced tea, and flavored milk. Drink enough water to keep your urine pale yellow. Do not follow a fad diet. Fad diets can be unhealthy and even dangerous. Other healthful choices include: Eat at home more often. This gives you more control over what you eat. Learn to read food labels.   This will help you understand how much food is considered one  serving. Learn what a healthy serving size is. Physical activity Exercise regularly, as told by your health care provider. Most adults should get up to 150 minutes of moderate-intensity exercise every week. Ask your health care provider what types of exercise are safe for you and how often you should exercise. Warm up and stretch before being active. Cool down and stretch after being active. Rest between periods of activity. Lifestyle Work with your health care provider and a dietitian to set a weight-loss goal that is healthy and reasonable for you. Limit your screen time. Find ways to reward yourself that do not involve food. Do not drink alcohol if: Your health care provider tells you not to drink. You are pregnant, may be pregnant, or are planning to become pregnant. If you drink alcohol: Limit how much you have to: 0-1 drink a day for women. 0-2 drinks a day for men. Know how much alcohol is in your drink. In the [place] one drink equals one 12 oz bottle of beer (355 mL), one 5 oz glass of wine (148 mL), or one 1 oz glass of hard liquor (44 mL). General instructions Keep a weight-loss journal to keep track of the food you eat and how much exercise you get. Take over-the-counter and prescription medicines only as told by your health care provider. Take vitamins and supplements only as told by your health care provider. Consider joining a support group. Your health care provider may be able to recommend a support group. Pay attention to your mental health as obesity can lead to depression or self esteem issues. Keep all follow-up visits. This is important. Contact a health care provider if: You are unable to meet your weight-loss goal after six weeks of dietary and lifestyle changes. You have trouble breathing. Summary Obesity is the condition of having too much total body fat. Being overweight or obese means that your weight is greater than what is considered healthy for your body  size. Work with your health care provider and a dietitian to set a weight-loss goal that is healthy and reasonable for you. Exercise regularly, as told by your health care provider. Ask your health care provider what types of exercise are safe for you and how often you should exercise. This information is not intended to replace advice given to you by your health care provider. Make sure you discuss any questions you have with your health care provider. Document Revised: 12/30/2020 Document Reviewed: 12/30/2020 Elsevier Patient Education  2023 Elsevier Inc.  

## 2022-04-20 NOTE — Progress Notes (Signed)
Subjective:   By signing my name below, I, Jill Horn, attest that this documentation has been prepared under the direction and in the presence of Ann Held, DO. 04/20/2022      Patient ID: Jill Horn, female    DOB: 1975/11/04, 46 y.o.   MRN: [id number]  Chief Complaint  Patient presents with  . Weight Check    HPI Patient is in today for a office visit.   She continues taking wegovy and reports no new issues while taking it. She has lost 20 lb's while taking it. She has not increased her dose from 1.7 mg. She weighs 226 lb's. Wt Readings from Last 3 Encounters:  04/20/22 206 lb 6.4 oz (93.6 kg)  08/17/21 226 lb 12.8 oz (102.9 kg)  04/28/18 219 lb (99.3 kg)    Past Medical History:  Diagnosis Date  . Allergy   . Hyperlipidemia   . PCOS (polycystic ovarian syndrome)     Past Surgical History:  Procedure Laterality Date  . CARPAL TUNNEL RELEASE  07/2006  . CESAREAN SECTION     x2    Family History  Problem Relation Age of Onset  . Hyperlipidemia Unknown   . Hypertension Unknown   . Lung cancer Paternal Grandfather     Social History   Socioeconomic History  . Marital status: Married    Spouse name: Not on file  . Number of children: Not on file  . Years of education: Not on file  . Highest education level: Not on file  Occupational History  . Occupation: Pharm Rep    Employer: [employer]  Tobacco Use  . Smoking status: Never  . Smokeless tobacco: Never  Substance and Sexual Activity  . Alcohol use: No  . Drug use: No  . Sexual activity: Yes    Birth control/protection: None  Other Topics Concern  . Not on file  Social History Narrative   No exercise    Lives at home with husband and 2 kids   Social Determinants of Health   Financial Resource Strain: Not on file  Food Insecurity: Not on file  Transportation Needs: Not on file  Physical Activity: Not on file  Stress: Not on file  Social Connections: Not on file  Intimate Partner  Violence: Not on file    Outpatient Medications Prior to Visit  Medication Sig Dispense Refill  . Cholecalciferol (VITAMIN D3) 50000 units CAPS Take 1 capsule by mouth daily.    . Continuous Blood Gluc Sensor (FREESTYLE LIBRE 3 SENSOR) MISC 1 Units by Does not apply route every 14 (fourteen) days. Place 1 sensor on the skin every 14 days. Use to check glucose continuously 6 each 3  . loratadine (CLARITIN) 10 MG tablet Take 1 tablet (10 mg total) by mouth daily. 90 tablet 3  . Semaglutide-Weight Management (WEGOVY) 2.4 MG/0.75ML SOAJ Inject 2.4 mg into the skin once a week. 3 mL 2  . spironolactone (ALDACTONE) 50 MG tablet Take 50 mg by mouth daily.     No facility-administered medications prior to visit.    Allergies  Allergen Reactions  . Amoxicillin     REACTION: rash -whelps  . Prednisone     REACTION: anaphylactic    Review of Systems  Constitutional:  Negative for fever.  HENT:  Negative for congestion.   Eyes:  Negative for blurred vision.  Respiratory:  Negative for cough.   Cardiovascular:  Negative for chest pain and palpitations.  Gastrointestinal:  Negative for vomiting.  Musculoskeletal:  Negative for back pain.  Skin:  Negative for rash.  Neurological:  Negative for loss of consciousness and headaches.      Objective:    Physical Exam Vitals and nursing note reviewed.  Constitutional:      General: She is not in acute distress.    Appearance: Normal appearance. She is not ill-appearing.  HENT:     Head: Normocephalic and atraumatic.     Right Ear: External ear normal.     Left Ear: External ear normal.  Eyes:     Extraocular Movements: Extraocular movements intact.     Pupils: Pupils are equal, round, and reactive to light.  Cardiovascular:     Rate and Rhythm: Normal rate and regular rhythm.     Heart sounds: Normal heart sounds. No murmur heard.    No gallop.  Pulmonary:     Effort: Pulmonary effort is normal. No respiratory distress.     Breath  sounds: Normal breath sounds. No wheezing or rales.  Skin:    General: Skin is warm and dry.  Neurological:     Mental Status: She is alert and oriented to person, place, and time.  Psychiatric:        Judgment: Judgment normal.    BP 126/80 (BP Location: Left Arm, Patient Position: Sitting, Cuff Size: Normal)   Pulse 87   Temp 98.5 F (36.9 C) (Oral)   Resp 18   Ht '5\' 7"'$  (1.702 m)   Wt 206 lb 6.4 oz (93.6 kg)   SpO2 97%   BMI 32.33 kg/m  Wt Readings from Last 3 Encounters:  04/20/22 206 lb 6.4 oz (93.6 kg)  08/17/21 226 lb 12.8 oz (102.9 kg)  04/28/18 219 lb (99.3 kg)    Diabetic Foot Exam - Simple   No data filed    Lab Results  Component Value Date   WBC 8.0 08/18/2021   HGB 14.6 08/18/2021   HCT 43.7 08/18/2021   PLT 212.0 08/18/2021   GLUCOSE 96 08/18/2021   CHOL 201 (H) 08/18/2021   TRIG 122.0 08/18/2021   HDL 49.00 08/18/2021   LDLDIRECT 155.5 06/22/2013   LDLCALC 128 (H) 08/18/2021   ALT 46 (H) 08/18/2021   AST 33 08/18/2021   NA 137 08/18/2021   K 4.4 08/18/2021   CL 103 08/18/2021   CREATININE 0.78 08/18/2021   BUN 13 08/18/2021   CO2 28 08/18/2021   TSH 1.30 11/25/2015   HGBA1C 6.0 08/18/2021    Lab Results  Component Value Date   TSH 1.30 11/25/2015   Lab Results  Component Value Date   WBC 8.0 08/18/2021   HGB 14.6 08/18/2021   HCT 43.7 08/18/2021   MCV 89.7 08/18/2021   PLT 212.0 08/18/2021   Lab Results  Component Value Date   NA 137 08/18/2021   K 4.4 08/18/2021   CO2 28 08/18/2021   GLUCOSE 96 08/18/2021   BUN 13 08/18/2021   CREATININE 0.78 08/18/2021   BILITOT 0.7 08/18/2021   ALKPHOS 74 08/18/2021   AST 33 08/18/2021   ALT 46 (H) 08/18/2021   PROT 6.4 08/18/2021   ALBUMIN 4.3 08/18/2021   CALCIUM 9.1 08/18/2021   GFR 90.77 08/18/2021   Lab Results  Component Value Date   CHOL 201 (H) 08/18/2021   Lab Results  Component Value Date   HDL 49.00 08/18/2021   Lab Results  Component Value Date   LDLCALC 128 (H)  08/18/2021   Lab Results  Component Value Date  TRIG 122.0 08/18/2021   Lab Results  Component Value Date   CHOLHDL 4 08/18/2021   Lab Results  Component Value Date   HGBA1C 6.0 08/18/2021       Assessment & Plan:   Problem List Items Addressed This Visit   None    No orders of the defined types were placed in this encounter.   I, Jill Horn, personally preformed the services described in this documentation.  All medical record entries made by the scribe were at my direction and in my presence.  I have reviewed the chart and discharge instructions (if applicable) and agree that the record reflects my personal performance and is accurate and complete. 04/20/2022   I,Jill Horn,acting as a scribe for Ann Held, DO.,have documented all relevant documentation on the behalf of Ann Held, DO,as directed by  Ann Held, DO while in the presence of Ann Held, DO.   Jill Horn

## 2022-05-19 DIAGNOSIS — J029 Acute pharyngitis, unspecified: Secondary | ICD-10-CM | POA: Diagnosis not present

## 2022-05-19 DIAGNOSIS — Z6830 Body mass index (BMI) 30.0-30.9, adult: Secondary | ICD-10-CM | POA: Diagnosis not present

## 2022-05-19 DIAGNOSIS — H66001 Acute suppurative otitis media without spontaneous rupture of ear drum, right ear: Secondary | ICD-10-CM | POA: Diagnosis not present

## 2022-06-05 ENCOUNTER — Encounter: Payer: Self-pay | Admitting: Family Medicine

## 2022-06-08 ENCOUNTER — Telehealth: Payer: Self-pay

## 2022-06-08 NOTE — Telephone Encounter (Signed)
PA initiated via Covermymeds; KEY: [id number]. Awaiting determination.

## 2022-06-08 NOTE — Telephone Encounter (Signed)
PA approved.    [id number];LMBEML:[id number];Review Type:Prior Auth;Coverage Start Date:05/09/2022;Coverage End Date:06/08/2023;

## 2022-08-24 DIAGNOSIS — Z1231 Encounter for screening mammogram for malignant neoplasm of breast: Secondary | ICD-10-CM | POA: Diagnosis not present

## 2022-08-24 DIAGNOSIS — Z01419 Encounter for gynecological examination (general) (routine) without abnormal findings: Secondary | ICD-10-CM | POA: Diagnosis not present

## 2022-08-24 DIAGNOSIS — Z6833 Body mass index (BMI) 33.0-33.9, adult: Secondary | ICD-10-CM | POA: Diagnosis not present

## 2022-08-24 DIAGNOSIS — Z124 Encounter for screening for malignant neoplasm of cervix: Secondary | ICD-10-CM | POA: Diagnosis not present

## 2022-09-29 ENCOUNTER — Other Ambulatory Visit: Payer: Self-pay | Admitting: Family Medicine

## 2022-09-29 DIAGNOSIS — E282 Polycystic ovarian syndrome: Secondary | ICD-10-CM

## 2022-10-11 ENCOUNTER — Telehealth: Payer: Self-pay | Admitting: Family Medicine

## 2022-10-11 NOTE — Telephone Encounter (Signed)
Pt called stating that the referral for her colonoscopy had expired and she needed to look into getting a new one for [facility].

## 2022-10-12 ENCOUNTER — Other Ambulatory Visit: Payer: Self-pay

## 2022-10-12 DIAGNOSIS — Z1211 Encounter for screening for malignant neoplasm of colon: Secondary | ICD-10-CM

## 2022-10-12 NOTE — Telephone Encounter (Signed)
Orders placed.

## 2022-10-15 ENCOUNTER — Telehealth: Payer: Self-pay | Admitting: Family Medicine

## 2022-10-15 NOTE — Telephone Encounter (Signed)
Attempted to contact patient and advised that orders have been placed but VM was full

## 2022-11-05 ENCOUNTER — Other Ambulatory Visit: Payer: Self-pay | Admitting: *Deleted

## 2022-11-05 ENCOUNTER — Encounter: Payer: Self-pay | Admitting: *Deleted

## 2022-11-05 ENCOUNTER — Encounter: Payer: Self-pay | Admitting: Gastroenterology

## 2022-11-05 DIAGNOSIS — E282 Polycystic ovarian syndrome: Secondary | ICD-10-CM

## 2022-11-05 MED ORDER — WEGOVY 2.4 MG/0.75ML ~~LOC~~ SOAJ: 2.4000 mg | SUBCUTANEOUS | 0 refills | Status: DC

## 2022-11-05 MED ORDER — FREESTYLE LIBRE 3 SENSOR MISC: 0 refills | Status: DC

## 2022-11-15 ENCOUNTER — Ambulatory Visit (AMBULATORY_SURGERY_CENTER): Payer: BC Managed Care – PPO | Admitting: *Deleted

## 2022-11-15 VITALS — Ht 67.0 in | Wt 195.0 lb

## 2022-11-15 DIAGNOSIS — Z1211 Encounter for screening for malignant neoplasm of colon: Secondary | ICD-10-CM

## 2022-11-15 MED ORDER — NA SULFATE-K SULFATE-MG SULF 17.5-3.13-1.6 GM/177ML PO SOLN: 1.0000 | Freq: Once | ORAL | 0 refills | Status: AC

## 2022-11-15 NOTE — Progress Notes (Signed)
.  Pt's name and DOB verified at the beginning of the pre-visit.  Pt denies any difficulty with ambulating,sitting, laying down or rolling side to side Gave both [facility] main # and MD on call # prior to instructions.  No egg or soy allergy known to patient  No issues known to pt with past sedation with any surgeries or procedures Pt denies having issues being intubated Pt has no issues moving head neck or swallowing No FH of Malignant Hyperthermia Pt is not on diet pills Pt is not on home 02  Pt is not on blood thinners  Pt denies issues with constipation  Pt has frequent issues with constipation RN instructed pt to use Miralax per bottles instructions a week before prep days. Pt states they will Pt is not on dialysis Pt denise any abnormal heart rhythms  Pt denies any upcoming cardiac testing Pt encouraged to use to use Singlecare or Goodrx to reduce cost  Patient's chart reviewed by Cathlyn Parsons CNRA prior to pre-visit and patient appropriate for the [facility].  Pre-visit completed and red dot placed by patient's name on their procedure day (on provider's schedule).  . Visit by phone Pt states weight is 206lb Instructed pt why it is important to and  to call if they have any changes in health or new medications. Directed them to the # given and on instructions.   Pt states they will.  Instructions reviewed with pt and pt states understanding. Instructed to review again prior to procedure. Pt states they will.  Instructions sent by mail with coupon and by my chart

## 2022-11-16 ENCOUNTER — Encounter: Payer: Self-pay | Admitting: Gastroenterology

## 2022-11-23 ENCOUNTER — Other Ambulatory Visit: Payer: Self-pay | Admitting: Family Medicine

## 2022-11-30 ENCOUNTER — Encounter: Payer: Self-pay | Admitting: Gastroenterology

## 2022-11-30 ENCOUNTER — Ambulatory Visit (AMBULATORY_SURGERY_CENTER): Payer: BC Managed Care – PPO | Admitting: Gastroenterology

## 2022-11-30 VITALS — BP 139/78 | HR 83 | Temp 98.9°F | Resp 18 | Ht 67.0 in | Wt 195.0 lb

## 2022-11-30 DIAGNOSIS — Z1211 Encounter for screening for malignant neoplasm of colon: Secondary | ICD-10-CM

## 2022-11-30 DIAGNOSIS — K635 Polyp of colon: Secondary | ICD-10-CM | POA: Diagnosis not present

## 2022-11-30 DIAGNOSIS — D120 Benign neoplasm of cecum: Secondary | ICD-10-CM

## 2022-11-30 DIAGNOSIS — K640 First degree hemorrhoids: Secondary | ICD-10-CM

## 2022-11-30 DIAGNOSIS — D122 Benign neoplasm of ascending colon: Secondary | ICD-10-CM | POA: Diagnosis not present

## 2022-11-30 MED ORDER — SODIUM CHLORIDE 0.9 % IV SOLN
500.0000 mL | Freq: Once | INTRAVENOUS | Status: DC
Start: 2022-11-30 — End: 2022-11-30

## 2022-11-30 NOTE — Progress Notes (Signed)
Called to room to assist during endoscopic procedure.  Patient ID and intended procedure confirmed with present staff. Received instructions for my participation in the procedure from the performing physician.  

## 2022-11-30 NOTE — Progress Notes (Signed)
VS by CW  Pt's states no medical or surgical changes since previsit or office visit.  

## 2022-11-30 NOTE — Progress Notes (Signed)
GASTROENTEROLOGY PROCEDURE H&P NOTE   Primary Care Physician: Donato Schultz, DO    Reason for Procedure:  Colon Cancer screening  Plan:    Colonoscopy  Patient is appropriate for endoscopic procedure(s) in the ambulatory ([facility]) setting.  The nature of the procedure, as well as the risks, benefits, and alternatives were carefully and thoroughly reviewed with the patient. Ample time for discussion and questions allowed. The patient understood, was satisfied, and agreed to proceed.     HPI: Jill Horn is a 47 y.o. female who presents for colonoscopy for routine Colon Cancer screening.  No active GI symptoms.  No known family history of colon cancer or related malignancy.  Patient is otherwise without complaints or active issues today.  Past Medical History:  Diagnosis Date   Allergy    Hyperlipidemia    PCOS (polycystic ovarian syndrome)     Past Surgical History:  Procedure Laterality Date   CARPAL TUNNEL RELEASE  07/08/2006   CESAREAN SECTION     x2   COLONOSCOPY      Prior to Admission medications   Medication Sig Start Date End Date Taking? Authorizing Provider  Cholecalciferol (VITAMIN D3) 50000 units CAPS Take 1 capsule by mouth daily.   Yes [provider]  loratadine (CLARITIN) 10 MG tablet Take 1 tablet (10 mg total) by mouth daily. 05/25/13  Yes Seabron Spates R, DO  spironolactone (ALDACTONE) 50 MG tablet Take 50 mg by mouth daily. 08/13/21  Yes [provider]  Continuous Glucose Sensor (FREESTYLE LIBRE 3 SENSOR) MISC PLACE 1 SENSOR ON THE SKIN EVERY 14 DAYS. USE TO CHECK GLUCOSE CONTINUOUSLY 11/05/22   Zola Button, Grayling Congress, DO  Semaglutide-Weight Management (WEGOVY) 2.4 MG/0.75ML SOAJ Inject 2.4 mg into the skin once a week. 11/05/22   Donato Schultz, DO    Current Outpatient Medications  Medication Sig Dispense Refill   Cholecalciferol (VITAMIN D3) 50000 units CAPS Take 1 capsule by mouth daily.     loratadine  (CLARITIN) 10 MG tablet Take 1 tablet (10 mg total) by mouth daily. 90 tablet 3   spironolactone (ALDACTONE) 50 MG tablet Take 50 mg by mouth daily.     Continuous Glucose Sensor (FREESTYLE LIBRE 3 SENSOR) MISC PLACE 1 SENSOR ON THE SKIN EVERY 14 DAYS. USE TO CHECK GLUCOSE CONTINUOUSLY 6 each 0   Semaglutide-Weight Management (WEGOVY) 2.4 MG/0.75ML SOAJ Inject 2.4 mg into the skin once a week. 6 mL 0   Current Facility-Administered Medications  Medication Dose Route Frequency Provider Last Rate Last Admin   0.9 %  sodium chloride infusion  500 mL Intravenous Once Daviona Herbert V, DO        Allergies as of 11/30/2022 - Review Complete 11/30/2022  Allergen Reaction Noted   Amoxicillin  06/30/2007   Prednisone  06/30/2007    Family History  Problem Relation Age of Onset   Lung cancer Paternal Grandfather    Hyperlipidemia Other    Hypertension Other    Colon polyps Neg Hx    Colon cancer Neg Hx    Esophageal cancer Neg Hx    Rectal cancer Neg Hx    Stomach cancer Neg Hx     Social History   Socioeconomic History   Marital status: Married    Spouse name: Not on file   Number of children: Not on file   Years of education: Not on file   Highest education level: Not on file  Occupational History   Occupation:  Pharm Rep    Employer: [employer]  Tobacco Use   Smoking status: Never   Smokeless tobacco: Never  Vaping Use   Vaping Use: Never used  Substance and Sexual Activity   Alcohol use: No   Drug use: No   Sexual activity: Yes    Birth control/protection: None  Other Topics Concern   Not on file  Social History Narrative   No exercise    Lives at home with husband and 2 kids   Social Determinants of Health   Financial Resource Strain: Not on file  Food Insecurity: Not on file  Transportation Needs: Not on file  Physical Activity: Not on file  Stress: Not on file  Social Connections: Not on file  Intimate Partner Violence: Not on file    Physical Exam: Vital  signs in last 24 hours: @BP  124/77   Pulse 84   Temp 98.9 F (37.2 C)   Ht 5\' 7"  (1.702 m)   Wt 195 lb (88.5 kg)   LMP 11/16/2022   SpO2 97%   BMI 30.54 kg/m  GEN: NAD EYE: Sclerae anicteric ENT: MMM CV: Non-tachycardic Pulm: CTA b/l GI: Soft, NT/ND NEURO:  Alert & Oriented x 3   Doristine Locks, DO [facility]   11/30/2022 10:28 AM

## 2022-11-30 NOTE — Progress Notes (Signed)
Colonoscopy completed without issue.  Unfortunately the endoscopy software, Provation, not currently working at the time of typing the report.  Colonoscopy findings are as follows:  - 4 polyps were found, including 3 in the cecum and 1 in the ascending colon, measuring 3-6 mm.  All were removed with cold without issue. - Small, nonbleeding internal hemorrhoids found on retroflexion - The remainder of the colonoscopy was normal  Recommendations: - Will discharge home today after recovery - Will follow-up on pathology report - Repeat colonoscopy depending on pathology results - Can follow-up in the GI clinic as needed   Doristine Locks, DO, [facility]

## 2022-11-30 NOTE — Progress Notes (Signed)
Report to PACU, RN, vss, BBS= Clear.  

## 2022-11-30 NOTE — Patient Instructions (Addendum)
Colonoscopy completed without issue.  Unfortunately the endoscopy software, Provation, not currently working at the time of typing the report.  Colonoscopy findings are as follows:   - 4 polyps were found, including 3 in the cecum and 1 in the ascending colon, measuring 3-6 mm.  All were removed with cold without issue. - Small, nonbleeding internal hemorrhoids found on retroflexion - The remainder of the colonoscopy was normal   Recommendations: - Will discharge home today after recovery - Will follow-up on pathology report - Repeat colonoscopy depending on pathology results - Can follow-up in the GI clinic as needed     Doristine Locks, DO, [facility]     Handouts on polyps & hemorrhoids given to you today.   Await pathology results on polyps removed    YOU HAD AN ENDOSCOPIC PROCEDURE TODAY AT THE [facility]:   Refer to the procedure report that was given to you for any specific questions about what was found during the examination.  If the procedure report does not answer your questions, please call your gastroenterologist to clarify.  If you requested that your care partner not be given the details of your procedure findings, then the procedure report has been included in a sealed envelope for you to review at your convenience later.  YOU SHOULD EXPECT: Some feelings of bloating in the abdomen. Passage of more gas than usual.  Walking can help get rid of the air that was put into your GI tract during the procedure and reduce the bloating. If you had a lower endoscopy (such as a colonoscopy or flexible sigmoidoscopy) you may notice spotting of blood in your stool or on the toilet paper. If you underwent a bowel prep for your procedure, you may not have a normal bowel movement for a few days.  Please Note:  You might notice some irritation and congestion in your nose or some drainage.  This is from the oxygen used during your procedure.  There is no  need for concern and it should clear up in a day or so.  SYMPTOMS TO REPORT IMMEDIATELY:  Following lower endoscopy (colonoscopy or flexible sigmoidoscopy):  Excessive amounts of blood in the stool  Significant tenderness or worsening of abdominal pains  Swelling of the abdomen that is new, acute  Fever of 100F or higher  For urgent or emergent issues, a gastroenterologist can be reached at any hour by calling [phone]. Do not use MyChart messaging for urgent concerns.    DIET:  We do recommend a small meal at first, but then you may proceed to your regular diet.  Drink plenty of fluids but you should avoid alcoholic beverages for 24 hours.  ACTIVITY:  You should plan to take it easy for the rest of today and you should NOT DRIVE or use heavy machinery until tomorrow (because of the sedation medicines used during the test).    FOLLOW UP: Our staff will call the number listed on your records the next business day following your procedure.  We will call around 7:15- 8:00 am to check on you and address any questions or concerns that you may have regarding the information given to you following your procedure. If we do not reach you, we will leave a message.     If any biopsies were taken you will be contacted by phone or by letter within the next 1-3 weeks.  Please call us at [phone] if you have not heard about the biopsies  in 3 weeks.    SIGNATURES/CONFIDENTIALITY: You and/or your care partner have signed paperwork which will be entered into your electronic medical record.  These signatures attest to the fact that that the information above on your After Visit Summary has been reviewed and is understood.  Full responsibility of the confidentiality of this discharge information lies with you and/or your care-partner.

## 2022-12-01 ENCOUNTER — Telehealth: Payer: Self-pay

## 2022-12-01 NOTE — Telephone Encounter (Signed)
Attempted f/u call. No answer, left VM. 

## 2022-12-30 ENCOUNTER — Telehealth: Payer: Self-pay | Admitting: Gastroenterology

## 2022-12-30 NOTE — Telephone Encounter (Signed)
PT is calling to have a nurse go over the pathology report for 6/25

## 2022-12-31 ENCOUNTER — Telehealth: Payer: Self-pay | Admitting: *Deleted

## 2022-12-31 NOTE — Telephone Encounter (Signed)
Returned the patient's call regarding her procedure report. That day unfortunately provation was down. Dr. Barron Alvine had typed out on her DC Summary the number of polyps and other results. Pathology results were sent to her and she saw them VIA mychart. Questions answered regarding types of Polyps and 5 year recall. PT verbalized understanding.

## 2022-12-31 NOTE — Telephone Encounter (Signed)
Called and spoke with patient. Pt reports that she had a procedure on 11/30/22 and never received her procedure report due to the system being down at that time. Pt states that she was told that her report would be mailed to her but she has not received anything. I told pt that I will have the [facility] look into this for her. We will be in touch once we have more information.

## 2023-01-03 ENCOUNTER — Encounter: Payer: BC Managed Care – PPO | Admitting: Gastroenterology

## 2023-01-11 ENCOUNTER — Other Ambulatory Visit: Payer: Self-pay | Admitting: Family Medicine

## 2023-01-11 DIAGNOSIS — E282 Polycystic ovarian syndrome: Secondary | ICD-10-CM

## 2023-01-17 ENCOUNTER — Other Ambulatory Visit: Payer: Self-pay | Admitting: Family Medicine

## 2023-08-03 DIAGNOSIS — X32XXXA Exposure to sunlight, initial encounter: Secondary | ICD-10-CM | POA: Diagnosis not present

## 2023-08-03 DIAGNOSIS — B078 Other viral warts: Secondary | ICD-10-CM | POA: Diagnosis not present

## 2023-08-03 DIAGNOSIS — L821 Other seborrheic keratosis: Secondary | ICD-10-CM | POA: Diagnosis not present

## 2023-08-03 DIAGNOSIS — L570 Actinic keratosis: Secondary | ICD-10-CM | POA: Diagnosis not present

## 2023-08-03 DIAGNOSIS — L814 Other melanin hyperpigmentation: Secondary | ICD-10-CM | POA: Diagnosis not present

## 2023-08-03 DIAGNOSIS — D2362 Other benign neoplasm of skin of left upper limb, including shoulder: Secondary | ICD-10-CM | POA: Diagnosis not present

## 2023-08-12 ENCOUNTER — Ambulatory Visit (INDEPENDENT_AMBULATORY_CARE_PROVIDER_SITE_OTHER): Payer: BC Managed Care – PPO | Admitting: Family Medicine

## 2023-08-12 ENCOUNTER — Encounter: Payer: Self-pay | Admitting: Family Medicine

## 2023-08-12 VITALS — BP 118/88 | HR 93 | Temp 98.5°F | Resp 18 | Ht 67.0 in | Wt 216.4 lb

## 2023-08-12 DIAGNOSIS — Z0000 Encounter for general adult medical examination without abnormal findings: Secondary | ICD-10-CM | POA: Diagnosis not present

## 2023-08-12 MED ORDER — FREESTYLE LIBRE 3 PLUS SENSOR MISC
11 refills | Status: AC
Start: 2023-08-12 — End: ?

## 2023-08-12 MED ORDER — ZEPBOUND 2.5 MG/0.5ML ~~LOC~~ SOAJ
2.5000 mg | SUBCUTANEOUS | 0 refills | Status: DC
Start: 2023-08-12 — End: 2023-08-18

## 2023-08-12 NOTE — Assessment & Plan Note (Signed)
 Ghm utd Check labs  See AVS Health Maintenance  Topic Date Due   HIV Screening  Never done   Cervical Cancer Screening (HPV/Pap Cotest)  01/06/2016   MAMMOGRAM  08/14/2022   COVID-19 Vaccine (4 - 2024-25 season) 08/28/2023 (Originally 02/06/2023)   INFLUENZA VACCINE  09/05/2023 (Originally 01/06/2023)   Colonoscopy  11/30/2027   DTaP/Tdap/Td (4 - Td or Tdap) 08/18/2031   Hepatitis C Screening  Completed   Pneumococcal Vaccine 14-50 Years old  Aged Out   HPV VACCINES  Aged Out

## 2023-08-12 NOTE — Progress Notes (Signed)
 Established Patient Office Visit  Subjective   Patient ID: DEMONI PARMAR, female    DOB: 02/24/1976  Age: 48 y.o. MRN: [id number]  Chief Complaint  Patient presents with   Annual Exam    Pt states not fasting     HPI Discussed the use of AI scribe software for clinical note transcription with the patient, who gave verbal consent to proceed.  History of Present Illness   The patient presents for an annual physical exam.  She has completed various health check-ups recently, including dermatology and dental visits. She mentions a past colonoscopy and regular mammograms, with the next one scheduled for May.  She describes her experience with [facility] for weight management, noting that she initially tolerated it well at a lower dose but experienced severe side effects, including vomiting, when she increased the dose. She has not taken it since and has noticed weight gain and fluctuations in her blood sugar levels since stopping the medication. She uses a continuous glucose monitoring system, specifically the [place], as part of her standard care. Her husband uses Dexcom due to previous issues with Libre's adhesion, and his insurance, [employer], covers [employer] but at a significant cost.  She mentions a recent dental issue where a filling was removed, leading to nerve pain and a subsequent root canal, which she describes as the most painful experience of her life.  She discusses a long-standing issue with her C-section scar, which she describes as 'botched' and causing discomfort, particularly in the summer.      Patient Active Problem List   Diagnosis Date Noted   Morbid obesity (HCC) 04/20/2022   PCOS (polycystic ovarian syndrome)    Obesity (BMI 30-39.9) 06/22/2013   Preventative health care 09/07/2010   ACUTE BRONCHITIS 08/13/2010   BENIGN NEOPLASM OF SKIN SITE UNSPECIFIED 09/04/2009   BREAST MASS, RIGHT 12/26/2007   Hyperlipidemia 08/31/2007   ALLERGIC RHINITIS 08/31/2007    URI 06/30/2007   COUGH 06/30/2007   Past Medical History:  Diagnosis Date   Allergy    Hyperlipidemia    PCOS (polycystic ovarian syndrome)    Past Surgical History:  Procedure Laterality Date   CARPAL TUNNEL RELEASE  07/08/2006   CESAREAN SECTION     x2   COLONOSCOPY     Social History   Tobacco Use   Smoking status: Never   Smokeless tobacco: Never  Vaping Use   Vaping status: Never Used  Substance Use Topics   Alcohol use: No   Drug use: No   Social History   Socioeconomic History   Marital status: Married    Spouse name: Not on file   Number of children: Not on file   Years of education: Not on file   Highest education level: Professional school degree (e.g., MD, DDS, DVM, JD)  Occupational History   Occupation: Pharm Rep    Employer: [employer]  Tobacco Use   Smoking status: Never   Smokeless tobacco: Never  Vaping Use   Vaping status: Never Used  Substance and Sexual Activity   Alcohol use: No   Drug use: No   Sexual activity: Yes    Birth control/protection: None  Other Topics Concern   Not on file  Social History Narrative   No exercise    Lives at home with husband and 2 kids   Social Drivers of Health   Financial Resource Strain: Low Risk  (08/11/2023)   Overall Financial Resource Strain (CARDIA)    Difficulty of Paying  Living Expenses: Not very hard  Food Insecurity: No Food Insecurity (08/11/2023)   Hunger Vital Sign    Worried About Running Out of Food in the Last Year: Never true    Ran Out of Food in the Last Year: Never true  Transportation Needs: No Transportation Needs (08/11/2023)   PRAPARE - Administrator, Civil Service (Medical): No    Lack of Transportation (Non-Medical): No  Physical Activity: Unknown (08/11/2023)   Exercise Vital Sign    Days of Exercise per Week: 0 days    Minutes of Exercise per Session: Not on file  Stress: Stress Concern Present (08/11/2023)   [employer] of Occupational Health - Occupational  Stress Questionnaire    Feeling of Stress : To some extent  Social Connections: Moderately Integrated (08/11/2023)   Social Connection and Isolation Panel [NHANES]    Frequency of Communication with Friends and Family: More than three times a week    Frequency of Social Gatherings with Friends and Family: Twice a week    Attends Religious Services: More than 4 times per year    Active Member of [employer] or Organizations: No    Attends Engineer, structural: Not on file    Marital Status: Married  Catering manager Violence: Not on file   Family Status  Relation Name Status   Mother  Alive   Father  Alive   PGF  (Not Specified)   Other  (Not Specified)   Other  (Not Specified)   Neg Hx  (Not Specified)  No partnership data on file   Family History  Problem Relation Age of Onset   Lung cancer Paternal Grandfather    Hyperlipidemia Other    Hypertension Other    Colon polyps Neg Hx    Colon cancer Neg Hx    Esophageal cancer Neg Hx    Rectal cancer Neg Hx    Stomach cancer Neg Hx    Allergies  Allergen Reactions   Amoxicillin     REACTION: rash -whelps   Prednisone     REACTION: anaphylactic      Review of Systems  Constitutional:  Negative for chills, fever and malaise/fatigue.  HENT:  Negative for congestion and hearing loss.   Eyes:  Negative for discharge.  Respiratory:  Negative for cough, sputum production and shortness of breath.   Cardiovascular:  Negative for chest pain, palpitations and leg swelling.  Gastrointestinal:  Negative for abdominal pain, blood in stool, constipation, diarrhea, heartburn, nausea and vomiting.  Genitourinary:  Negative for dysuria, frequency, hematuria and urgency.  Musculoskeletal:  Negative for back pain, falls and myalgias.  Skin:  Negative for rash.  Neurological:  Negative for dizziness, sensory change, loss of consciousness, weakness and headaches.  Endo/Heme/Allergies:  Negative for environmental allergies. Does not  bruise/bleed easily.  Psychiatric/Behavioral:  Negative for depression and suicidal ideas. The patient is not nervous/anxious and does not have insomnia.       Objective:     BP 118/88 (BP Location: Left Arm, Patient Position: Sitting, Cuff Size: Large)   Pulse 93   Temp 98.5 F (36.9 C) (Oral)   Resp 18   Ht 5\' 7"  (1.702 m)   Wt 216 lb 6.4 oz (98.2 kg)   LMP 08/06/2023   SpO2 96%   BMI 33.89 kg/m     Physical Exam Vitals and nursing note reviewed.  Constitutional:      General: She is not in acute distress.    Appearance: Normal  appearance. She is well-developed.  HENT:     Head: Normocephalic and atraumatic.  Eyes:     General: No scleral icterus.       Right eye: No discharge.        Left eye: No discharge.  Cardiovascular:     Rate and Rhythm: Normal rate and regular rhythm.     Heart sounds: No murmur heard. Pulmonary:     Effort: Pulmonary effort is normal. No respiratory distress.     Breath sounds: Normal breath sounds.  Musculoskeletal:        General: Normal range of motion.     Cervical back: Normal range of motion and neck supple.     Right lower leg: No edema.     Left lower leg: No edema.  Skin:    General: Skin is warm and dry.  Neurological:     Mental Status: She is alert and oriented to person, place, and time.  Psychiatric:        Mood and Affect: Mood normal.        Behavior: Behavior normal.        Thought Content: Thought content normal.        Judgment: Judgment normal.      No results found for any visits on 08/12/23.  Last CBC Lab Results  Component Value Date   WBC 8.0 08/18/2021   HGB 14.6 08/18/2021   HCT 43.7 08/18/2021   MCV 89.7 08/18/2021   RDW 13.4 08/18/2021   PLT 212.0 08/18/2021   Last metabolic panel Lab Results  Component Value Date   GLUCOSE 96 08/18/2021   NA 137 08/18/2021   K 4.4 08/18/2021   CL 103 08/18/2021   CO2 28 08/18/2021   BUN 13 08/18/2021   CREATININE 0.78 08/18/2021   GFR 90.77  08/18/2021   CALCIUM 9.1 08/18/2021   PROT 6.4 08/18/2021   ALBUMIN 4.3 08/18/2021   BILITOT 0.7 08/18/2021   ALKPHOS 74 08/18/2021   AST 33 08/18/2021   ALT 46 (H) 08/18/2021   Last lipids Lab Results  Component Value Date   CHOL 201 (H) 08/18/2021   HDL 49.00 08/18/2021   LDLCALC 128 (H) 08/18/2021   LDLDIRECT 155.5 06/22/2013   TRIG 122.0 08/18/2021   CHOLHDL 4 08/18/2021   Last hemoglobin A1c Lab Results  Component Value Date   HGBA1C 6.0 08/18/2021   Last thyroid functions Lab Results  Component Value Date   TSH 1.30 11/25/2015   Last vitamin D No results found for: "25OHVITD2", "25OHVITD3", "VD25OH" Last vitamin B12 and Folate No results found for: "VITAMINB12", "FOLATE"    The 10-year ASCVD risk score (Arnett DK, et al., 2019) is: 1%    Assessment & Plan:   Problem List Items Addressed This Visit       Unprioritized   Morbid obesity (HCC)   Relevant Medications   Continuous Glucose Sensor (FREESTYLE LIBRE 3 PLUS SENSOR) MISC   tirzepatide (ZEPBOUND) 2.5 MG/0.5ML Pen   Preventative health care - Primary   Ghm utd Check labs  See AVS Health Maintenance  Topic Date Due   HIV Screening  Never done   Cervical Cancer Screening (HPV/Pap Cotest)  01/06/2016   MAMMOGRAM  08/14/2022   COVID-19 Vaccine (4 - 2024-25 season) 08/28/2023 (Originally 02/06/2023)   INFLUENZA VACCINE  09/05/2023 (Originally 01/06/2023)   Colonoscopy  11/30/2027   DTaP/Tdap/Td (4 - Td or Tdap) 08/18/2031   Hepatitis C Screening  Completed   Pneumococcal Vaccine 25-42 Years old  Aged Out   HPV VACCINES  Aged Out        Assessment and Plan    Weight management   She previously used [facility] effectively at a lower dose but experienced severe nausea and vomiting at the 2.4 mg dose, leading to discontinuation. Since stopping, she reports weight gain and blood sugar fluctuations. Zepbound, a newer medication with potentially fewer side effects due to its combination of two medicines,  was discussed. It offers less severe side effects and effective use at lower doses. She is interested in trying this medication. Prescribe Zepbound, starting with 0.25 mg for four weeks, then increase to 0.5 mg. [facility] prescription to [facility].  Diabetes management   She uses a continuous glucose monitor (CGM) and is knowledgeable about both Josephine Igo and Dexcom systems. Her husband, who may have transitioned to type 1 diabetes, uses Dexcom. The benefits of the [place] 3 system, including improved adhesion and accuracy, were discussed. Libre 3 is noted for its 15-day wear and better adhesion, which may be beneficial for her husband as well. Continue using Libre 3 CGM.  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 A plastic surgeon might address the issue and potentially secure insurance coverage for the procedure. Dr. Arita Miss, a plastic surgeon, was recommended for consultation. Refer to Dr. Arita Miss for consultation regarding abdominal panniculus.  General Health Maintenance   She is up to date with her mammogram and flu shot, has regular dental and eye check-ups, and had a recent colonoscopy, not due again until 2029. She is proactive about her health maintenance and participates in various health programs through her work. Continue annual mammograms, regular dental and eye check-ups, and schedule the next colonoscopy in 2029.        No follow-ups on file.    Donato Schultz, DO   ++

## 2023-08-17 ENCOUNTER — Other Ambulatory Visit (HOSPITAL_COMMUNITY): Payer: Self-pay

## 2023-08-18 ENCOUNTER — Other Ambulatory Visit: Payer: Self-pay | Admitting: Family Medicine

## 2023-08-19 ENCOUNTER — Encounter: Payer: Self-pay | Admitting: Family Medicine

## 2023-08-19 ENCOUNTER — Other Ambulatory Visit: Payer: Self-pay | Admitting: Family Medicine

## 2023-08-19 ENCOUNTER — Telehealth: Admitting: Nurse Practitioner

## 2023-08-19 DIAGNOSIS — R300 Dysuria: Secondary | ICD-10-CM

## 2023-08-19 MED ORDER — ZEPBOUND 2.5 MG/0.5ML ~~LOC~~ SOAJ: 2.5000 mg | SUBCUTANEOUS | 0 refills | Status: DC

## 2023-08-19 MED ORDER — NITROFURANTOIN MONOHYD MACRO 100 MG PO CAPS: 100.0000 mg | ORAL_CAPSULE | Freq: Two times a day (BID) | ORAL | 0 refills | Status: DC

## 2023-08-19 NOTE — Progress Notes (Signed)
 Because of the color of your urine and associated fever , I feel your condition warrants further evaluation and I recommend that you be seen for a face to face visit.  Please contact your primary care physician practice to be seen. Many offices offer virtual options to be seen via video if you are not comfortable going in person to a medical facility at this time.  It would be best for you to have a urine culture collected via Primary care or Urgent Care   NOTE: You will NOT be charged for this eVisit.  If you do not have a PCP, [facility] offers a free physician referral service available at [phone]. Our trained staff has the experience, knowledge and resources to put you in touch with a physician who is right for you.    If you are having a true medical emergency please call 911.   Your e-visit answers were reviewed by a board certified advanced clinical practitioner to complete your personal care plan.  Thank you for using e-Visits.

## 2023-08-24 ENCOUNTER — Telehealth: Payer: Self-pay | Admitting: Pharmacy Technician

## 2023-08-24 ENCOUNTER — Other Ambulatory Visit (HOSPITAL_COMMUNITY): Payer: Self-pay

## 2023-08-24 NOTE — Telephone Encounter (Signed)
 Pharmacy Patient Advocate Encounter   Received notification from CoverMyMeds that prior authorization for Zepbound 2.5MG /0.5ML is required/requested.   Insurance verification completed.   The patient is insured through [employer] .   Per test claim: [id number]  Submitted and pending

## 2023-08-24 NOTE — Telephone Encounter (Signed)
 Pharmacy Patient Advocate Encounter  Received notification from [facility] that Prior Authorization for Zepbound 2.5MG /0.5ML  has been APPROVED from 07/25/23 to 04/20/24. Ran test claim, Copay is $0.00. This test claim was processed through [facility]- copay amounts may vary at other pharmacies due to pharmacy/plan contracts, or as the patient moves through the different stages of their insurance plan.   PA #/Case ID/Reference #: [id number]

## 2023-08-24 NOTE — Telephone Encounter (Signed)
 Just sent

## 2023-08-26 MED ORDER — NITROFURANTOIN MONOHYD MACRO 100 MG PO CAPS: 100.0000 mg | ORAL_CAPSULE | Freq: Two times a day (BID) | ORAL | 0 refills | Status: DC

## 2023-09-16 ENCOUNTER — Other Ambulatory Visit: Payer: Self-pay | Admitting: Family Medicine

## 2023-09-16 MED ORDER — ZEPBOUND 5 MG/0.5ML ~~LOC~~ SOAJ: 5.0000 mg | SUBCUTANEOUS | 0 refills | Status: DC

## 2023-09-30 DIAGNOSIS — Z1231 Encounter for screening mammogram for malignant neoplasm of breast: Secondary | ICD-10-CM | POA: Diagnosis not present

## 2023-09-30 DIAGNOSIS — Z6834 Body mass index (BMI) 34.0-34.9, adult: Secondary | ICD-10-CM | POA: Diagnosis not present

## 2023-09-30 DIAGNOSIS — Z124 Encounter for screening for malignant neoplasm of cervix: Secondary | ICD-10-CM | POA: Diagnosis not present

## 2023-09-30 DIAGNOSIS — Z01419 Encounter for gynecological examination (general) (routine) without abnormal findings: Secondary | ICD-10-CM | POA: Diagnosis not present

## 2023-09-30 LAB — HM PAP SMEAR: HM Pap smear: NORMAL

## 2023-09-30 LAB — HM MAMMOGRAPHY

## 2023-11-03 ENCOUNTER — Other Ambulatory Visit: Payer: Self-pay | Admitting: Family Medicine

## 2023-11-04 ENCOUNTER — Telehealth: Admitting: Family Medicine

## 2023-11-04 DIAGNOSIS — J209 Acute bronchitis, unspecified: Secondary | ICD-10-CM | POA: Diagnosis not present

## 2023-11-04 MED ORDER — PROMETHAZINE-DM 6.25-15 MG/5ML PO SYRP: 5.0000 mL | ORAL_SOLUTION | Freq: Four times a day (QID) | ORAL | 0 refills | Status: AC | PRN

## 2023-11-04 MED ORDER — CLARITHROMYCIN 500 MG PO TABS: 500.0000 mg | ORAL_TABLET | Freq: Two times a day (BID) | ORAL | 0 refills | Status: DC

## 2023-11-04 NOTE — Progress Notes (Signed)
 Virtual Visit Consent   [name], you are scheduled for a virtual visit with a [facility] provider today. Just as with appointments in the office, your consent must be obtained to participate. Your consent will be active for this visit and any virtual visit you may have with one of our providers in the next 365 days. If you have a MyChart account, a copy of this consent can be sent to you electronically.  As this is a virtual visit, video technology does not allow for your provider to perform a traditional examination. This may limit your provider's ability to fully assess your condition. If your provider identifies any concerns that need to be evaluated in person or the need to arrange testing (such as labs, EKG, etc.), we will make arrangements to do so. Although advances in technology are sophisticated, we cannot ensure that it will always work on either your end or our end. If the connection with a video visit is poor, the visit may have to be switched to a telephone visit. With either a video or telephone visit, we are not always able to ensure that we have a secure connection.  By engaging in this virtual visit, you consent to the provision of healthcare and authorize for your insurance to be billed (if applicable) for the services provided during this visit. Depending on your insurance coverage, you may receive a charge related to this service.  I need to obtain your verbal consent now. Are you willing to proceed with your visit today? Jill Horn has provided verbal consent on 11/04/2023 for a virtual visit (video or telephone). [name], FNP  Date: 11/04/2023 10:24 AM   Virtual Visit via Video Note   I, [name], connected with  Jill Horn  ([id number], [birth year]) on 11/04/23 at 10:15 AM EDT by a video-enabled telemedicine application and verified that I am speaking with the correct person using two identifiers.  Location: Patient: Virtual Visit Location Patient:  Home Provider: Virtual Visit Location Provider: Home Office   I discussed the limitations of evaluation and management by telemedicine and the availability of in person appointments. The patient expressed understanding and agreed to proceed.    History of Present Illness: Jill Horn is a 48 y.o. who identifies as a female who was assigned female at birth, and is being seen today for persistent cough for 9 days, cough, ear pain, no fever, wheezing and is not sob. Cough is worse at night. In no distress. [name]  HPI: HPI  Problems:  Patient Active Problem List   Diagnosis Date Noted   Morbid obesity (HCC) 04/20/2022   PCOS (polycystic ovarian syndrome)    Obesity (BMI 30-39.9) 06/22/2013   Preventative health care 09/07/2010   ACUTE BRONCHITIS 08/13/2010   BENIGN NEOPLASM OF SKIN SITE UNSPECIFIED 09/04/2009   BREAST MASS, RIGHT 12/26/2007   Hyperlipidemia 08/31/2007   ALLERGIC RHINITIS 08/31/2007   URI 06/30/2007   COUGH 06/30/2007    Allergies:  Allergies  Allergen Reactions   Amoxicillin     REACTION: rash -whelps   Prednisone     REACTION: anaphylactic   Medications:  Current Outpatient Medications:    clarithromycin  (BIAXIN ) 500 MG tablet, Take 1 tablet (500 mg total) by mouth 2 (two) times daily for 10 days., Disp: 20 tablet, Rfl: 0   promethazine-dextromethorphan (PROMETHAZINE-DM) 6.25-15 MG/5ML syrup, Take 5 mLs by mouth 4 (four) times daily as needed for up to 10 days for cough., Disp: 118 mL, Rfl: 0  Cholecalciferol (VITAMIN D3) 50000 units CAPS, Take 1 capsule by mouth daily., Disp: , Rfl:    Continuous Glucose Sensor (FREESTYLE LIBRE 3 PLUS SENSOR) MISC, Change sensor every 15 days., Disp: 2 each, Rfl: 11   loratadine  (CLARITIN ) 10 MG tablet, Take 1 tablet (10 mg total) by mouth daily., Disp: 90 tablet, Rfl: 3   nitrofurantoin , macrocrystal-monohydrate, (MACROBID ) 100 MG capsule, Take 1 capsule (100 mg total) by mouth 2 (two) times daily., Disp: 14 capsule, Rfl: 0    nitrofurantoin , macrocrystal-monohydrate, (MACROBID ) 100 MG capsule, Take 1 capsule (100 mg total) by mouth 2 (two) times daily., Disp: 14 capsule, Rfl: 0   spironolactone (ALDACTONE) 50 MG tablet, Take 50 mg by mouth daily., Disp: , Rfl:    tirzepatide  (ZEPBOUND ) 5 MG/0.5ML Pen, Inject 5 mg into the skin once a week., Disp: 2 mL, Rfl: 0  Observations/Objective: Patient is well-developed, well-nourished in no acute distress.  Resting comfortably  at home.  Head is normocephalic, atraumatic.  No labored breathing.  Speech is clear and coherent with logical content.  Patient is alert and oriented at baseline.    Assessment and Plan: 1. Acute bronchitis, unspecified organism (Primary)  Increase fluids, humidifier at night, tylenol  or ibuprofen as directed, UC as needed.   Follow Up Instructions: I discussed the assessment and treatment plan with the patient. The patient was provided an opportunity to ask questions and all were answered. The patient agreed with the plan and demonstrated an understanding of the instructions.  A copy of instructions were sent to the patient via MyChart unless otherwise noted below.     The patient was advised to call back or seek an in-person evaluation if the symptoms worsen or if the condition fails to improve as anticipated.    [name], FNP

## 2023-11-04 NOTE — Patient Instructions (Signed)

## 2023-11-09 ENCOUNTER — Encounter: Payer: Self-pay | Admitting: Family Medicine

## 2023-11-11 NOTE — Addendum Note (Signed)
 Addended by: [name] on: 11/11/2023 03:07 PM   Modules accepted: Level of Service

## 2023-11-11 NOTE — Telephone Encounter (Signed)
Form printed and placed in office

## 2023-12-21 ENCOUNTER — Other Ambulatory Visit: Payer: Self-pay | Admitting: Family Medicine

## 2023-12-22 ENCOUNTER — Other Ambulatory Visit: Payer: Self-pay | Admitting: Family

## 2024-03-12 DIAGNOSIS — J0100 Acute maxillary sinusitis, unspecified: Secondary | ICD-10-CM | POA: Diagnosis not present

## 2024-03-12 DIAGNOSIS — J209 Acute bronchitis, unspecified: Secondary | ICD-10-CM | POA: Diagnosis not present

## 2024-03-12 DIAGNOSIS — Z6831 Body mass index (BMI) 31.0-31.9, adult: Secondary | ICD-10-CM | POA: Diagnosis not present

## 2024-03-27 ENCOUNTER — Other Ambulatory Visit (HOSPITAL_COMMUNITY): Payer: Self-pay

## 2024-04-03 ENCOUNTER — Other Ambulatory Visit (HOSPITAL_COMMUNITY): Payer: Self-pay

## 2024-04-05 ENCOUNTER — Telehealth: Payer: Self-pay

## 2024-04-05 NOTE — Telephone Encounter (Signed)
 Called pt and was unable to lvm so I sent a mychart for pt to sched

## 2024-04-05 NOTE — Telephone Encounter (Signed)
 Patient hasn't been seen in office since March 2025. Plan requires updated chart notes for PA renewal. Pt needs appointment please

## 2024-04-05 NOTE — Telephone Encounter (Signed)
 Pharmacy Patient Advocate Encounter   Received notification from CoverMyMeds that prior authorization for Zepbound  2.5MG /0.5ML pen-injectors is due for renewal.   Insurance verification completed.   The patient is insured through [employer].  Action: Patient hasn't been seen in your office since March 2025. Plan requires updated chart notes for PA renewal.

## 2024-06-19 ENCOUNTER — Ambulatory Visit (INDEPENDENT_AMBULATORY_CARE_PROVIDER_SITE_OTHER): Admitting: Family Medicine

## 2024-06-19 ENCOUNTER — Ambulatory Visit (HOSPITAL_BASED_OUTPATIENT_CLINIC_OR_DEPARTMENT_OTHER)
Admission: RE | Admit: 2024-06-19 | Discharge: 2024-06-19 | Disposition: A | Discharge status: Home / Self Care | Source: Ambulatory Visit | Attending: Family Medicine | Admitting: Family Medicine

## 2024-06-19 ENCOUNTER — Encounter: Payer: Self-pay | Admitting: Family Medicine

## 2024-06-19 ENCOUNTER — Ambulatory Visit: Payer: Self-pay | Admitting: Family Medicine

## 2024-06-19 VITALS — BP 120/82 | HR 101 | Temp 99.0°F | Resp 18 | Ht 67.0 in | Wt 212.4 lb

## 2024-06-19 DIAGNOSIS — J209 Acute bronchitis, unspecified: Secondary | ICD-10-CM | POA: Insufficient documentation

## 2024-06-19 MED ORDER — PROMETHAZINE-DM 6.25-15 MG/5ML PO SYRP: 5.0000 mL | ORAL_SOLUTION | Freq: Four times a day (QID) | ORAL | 0 refills | Status: AC | PRN

## 2024-06-19 MED ORDER — METHYLPREDNISOLONE ACETATE 80 MG/ML IJ SUSP
80.0000 mg | Freq: Once | INTRAMUSCULAR | Status: AC
  Administered 2024-06-19: 80 mg via INTRAMUSCULAR

## 2024-06-19 MED ORDER — CLARITHROMYCIN 500 MG PO TABS: 500.0000 mg | ORAL_TABLET | Freq: Two times a day (BID) | ORAL | 0 refills | Status: AC

## 2024-06-19 NOTE — Progress Notes (Signed)
 "  Subjective:    Patient ID: Jill Horn, female    DOB: Sep 24, 1975, 49 y.o.   MRN: [id number]  Chief Complaint  Patient presents with   Cough    Pt states having the flu around christmas and states still having cough and unable to sleep.     HPI Patient is in today for flu like symptoms.  Discussed the use of AI scribe software for clinical note transcription with the patient, who gave verbal consent to proceed.  History of Present Illness Jill Horn is a 49 year old female who presents with a persistent cough following a severe flu-like illness.  She became ill around Christmas after returning from a trip to [place] , where she and her mother contracted the flu. Her mother tested positive for the flu, but she did not get tested, assuming she had the same illness. She did not receive the flu vaccine prior to this illness.  The illness was severe, with symptoms including a high fever, body aches, and significant fatigue, confining her to bed for about seven days. She initially sought care through a virtual visit and was prescribed doxycycline and albuterol . She completed both medications but continues to experience a persistent cough.  The cough is particularly disruptive at night, affecting her sleep and causing her to cough up green sputum in the mornings. She has tried various over-the-counter remedies, including cough drops and Nyquil, without relief. The cough is severe enough to cause urinary incontinence and disrupt her family's sleep.  She has a history of recurrent respiratory infections, having been prescribed doxycycline in September and clarithromycin  in May of the previous year. She reports this year as being her 'sickest year' with prolonged illness durations.  Her family, including her husband and children, received Tamiflu  and recovered without severe illness. She is currently taking progesterone at night, which helps with sleep when she is not sick, and has  previously been prescribed estradiol for UTIs.    Past Medical History:  Diagnosis Date   Allergy    Hyperlipidemia    PCOS (polycystic ovarian syndrome)     Past Surgical History:  Procedure Laterality Date   CARPAL TUNNEL RELEASE  07/08/2006   CESAREAN SECTION     x2   COLONOSCOPY      Family History  Problem Relation Age of Onset   Lung cancer Paternal Grandfather    Hyperlipidemia Other    Hypertension Other    Colon polyps Neg Hx    Colon cancer Neg Hx    Esophageal cancer Neg Hx    Rectal cancer Neg Hx    Stomach cancer Neg Hx     Social History   Socioeconomic History   Marital status: Married    Spouse name: Not on file   Number of children: Not on file   Years of education: Not on file   Highest education level: Professional school degree (e.g., MD, DDS, DVM, JD)  Occupational History   Occupation: Pharm Rep    Employer: [employer]  Tobacco Use   Smoking status: Never   Smokeless tobacco: Never  Vaping Use   Vaping status: Never Used  Substance and Sexual Activity   Alcohol use: No   Drug use: No   Sexual activity: Yes    Birth control/protection: None  Other Topics Concern   Not on file  Social History Narrative   No exercise    Lives at home with husband and 2 kids   Social Drivers  of Health   Tobacco Use: Low Risk (06/19/2024)   Patient History    Smoking Tobacco Use: Never    Smokeless Tobacco Use: Never    Passive Exposure: Not on file  Financial Resource Strain: Low Risk (08/11/2023)   Overall Financial Resource Strain (CARDIA)    Difficulty of Paying Living Expenses: Not very hard  Food Insecurity: No Food Insecurity (08/11/2023)   Hunger Vital Sign    Worried About Running Out of Food in the Last Year: Never true    Ran Out of Food in the Last Year: Never true  Transportation Needs: No Transportation Needs (08/11/2023)   PRAPARE - Administrator, Civil Service (Medical): No    Lack of Transportation (Non-Medical): No   Physical Activity: Unknown (08/11/2023)   Exercise Vital Sign    Days of Exercise per Week: 0 days    Minutes of Exercise per Session: Not on file  Stress: Stress Concern Present (08/11/2023)   [employer] of Occupational Health - Occupational Stress Questionnaire    Feeling of Stress : To some extent  Social Connections: Moderately Integrated (08/11/2023)   Social Connection and Isolation Panel    Frequency of Communication with Friends and Family: More than three times a week    Frequency of Social Gatherings with Friends and Family: Twice a week    Attends Religious Services: More than 4 times per year    Active Member of [employer] or Organizations: No    Attends Engineer, Structural: Not on file    Marital Status: Married  Catering Manager Violence: Not on file  Depression (PHQ2-9): Low Risk (08/12/2023)   Depression (PHQ2-9)    PHQ-2 Score: 0  Alcohol Screen: Not on file  Housing: Unknown (08/11/2023)   Housing Stability Vital Sign    Unable to Pay for Housing in the Last Year: No    Number of Times Moved in the Last Year: Not on file    Homeless in the Last Year: No  Utilities: Not on file  Health Literacy: Not on file    Outpatient Medications Prior to Visit  Medication Sig Dispense Refill   Cholecalciferol (VITAMIN D3) 50000 units CAPS Take 1 capsule by mouth daily.     Continuous Glucose Sensor (FREESTYLE LIBRE 3 PLUS SENSOR) MISC Change sensor every 15 days. 2 each 11   loratadine  (CLARITIN ) 10 MG tablet Take 1 tablet (10 mg total) by mouth daily. 90 tablet 3   spironolactone (ALDACTONE) 50 MG tablet Take 50 mg by mouth daily.     tirzepatide  (ZEPBOUND ) 5 MG/0.5ML Pen INJECT 5 MG SUBCUTANEOUSLY WEEKLY 2 mL 1   nitrofurantoin , macrocrystal-monohydrate, (MACROBID ) 100 MG capsule Take 1 capsule (100 mg total) by mouth 2 (two) times daily. 14 capsule 0   nitrofurantoin , macrocrystal-monohydrate, (MACROBID ) 100 MG capsule Take 1 capsule (100 mg total) by mouth 2 (two)  times daily. 14 capsule 0   No facility-administered medications prior to visit.    Allergies[1]  Review of Systems  Constitutional:  Negative for chills, fever and malaise/fatigue.  HENT:  Negative for congestion and hearing loss.   Eyes:  Negative for blurred vision and discharge.  Respiratory:  Positive for cough and sputum production. Negative for shortness of breath.   Cardiovascular:  Negative for chest pain, palpitations and leg swelling.  Gastrointestinal:  Negative for abdominal pain, blood in stool, constipation, diarrhea, heartburn, nausea and vomiting.  Genitourinary:  Negative for dysuria, frequency, hematuria and urgency.  Musculoskeletal:  Negative for  back pain, falls and myalgias.  Skin:  Negative for rash.  Neurological:  Negative for dizziness, sensory change, loss of consciousness, weakness and headaches.  Endo/Heme/Allergies:  Negative for environmental allergies. Does not bruise/bleed easily.  Psychiatric/Behavioral:  Negative for depression and suicidal ideas. The patient is not nervous/anxious and does not have insomnia.        Objective:    Physical Exam Vitals and nursing note reviewed.  Constitutional:      General: She is not in acute distress.    Appearance: Normal appearance. She is well-developed.  HENT:     Head: Normocephalic and atraumatic.  Eyes:     General: No scleral icterus.       Right eye: No discharge.        Left eye: No discharge.  Cardiovascular:     Rate and Rhythm: Normal rate and regular rhythm.     Heart sounds: No murmur heard. Pulmonary:     Effort: Pulmonary effort is normal. No respiratory distress.     Breath sounds: Normal breath sounds. No stridor. No wheezing, rhonchi or rales.  Chest:     Chest wall: No tenderness.  Musculoskeletal:        General: Normal range of motion.     Cervical back: Normal range of motion and neck supple.     Right lower leg: No edema.     Left lower leg: No edema.  Skin:    General:  Skin is warm and dry.  Neurological:     Mental Status: She is alert and oriented to person, place, and time.  Psychiatric:        Mood and Affect: Mood normal.        Behavior: Behavior normal.        Thought Content: Thought content normal.        Judgment: Judgment normal.     BP 120/82 (BP Location: Left Arm, Patient Position: Sitting, Cuff Size: Large)   Pulse (!) 101   Temp 99 F (37.2 C) (Oral)   Resp 18   Ht 5' 7 (1.702 m)   Wt 212 lb 6.4 oz (96.3 kg)   SpO2 97%   BMI 33.27 kg/m  Wt Readings from Last 3 Encounters:  06/19/24 212 lb 6.4 oz (96.3 kg)  08/12/23 216 lb 6.4 oz (98.2 kg)  11/30/22 195 lb (88.5 kg)    Diabetic Foot Exam - Simple   No data filed    Lab Results  Component Value Date   WBC 8.0 08/18/2021   HGB 14.6 08/18/2021   HCT 43.7 08/18/2021   PLT 212.0 08/18/2021   GLUCOSE 96 08/18/2021   CHOL 201 (H) 08/18/2021   TRIG 122.0 08/18/2021   HDL 49.00 08/18/2021   LDLDIRECT 155.5 06/22/2013   LDLCALC 128 (H) 08/18/2021   ALT 46 (H) 08/18/2021   AST 33 08/18/2021   NA 137 08/18/2021   K 4.4 08/18/2021   CL 103 08/18/2021   CREATININE 0.78 08/18/2021   BUN 13 08/18/2021   CO2 28 08/18/2021   TSH 1.30 11/25/2015   HGBA1C 6.0 08/18/2021    Lab Results  Component Value Date   TSH 1.30 11/25/2015   Lab Results  Component Value Date   WBC 8.0 08/18/2021   HGB 14.6 08/18/2021   HCT 43.7 08/18/2021   MCV 89.7 08/18/2021   PLT 212.0 08/18/2021   Lab Results  Component Value Date   NA 137 08/18/2021   K 4.4 08/18/2021   CO2  28 08/18/2021   GLUCOSE 96 08/18/2021   BUN 13 08/18/2021   CREATININE 0.78 08/18/2021   BILITOT 0.7 08/18/2021   ALKPHOS 74 08/18/2021   AST 33 08/18/2021   ALT 46 (H) 08/18/2021   PROT 6.4 08/18/2021   ALBUMIN 4.3 08/18/2021   CALCIUM 9.1 08/18/2021   GFR 90.77 08/18/2021   Lab Results  Component Value Date   CHOL 201 (H) 08/18/2021   Lab Results  Component Value Date   HDL 49.00 08/18/2021   Lab  Results  Component Value Date   LDLCALC 128 (H) 08/18/2021   Lab Results  Component Value Date   TRIG 122.0 08/18/2021   Lab Results  Component Value Date   CHOLHDL 4 08/18/2021   Lab Results  Component Value Date   HGBA1C 6.0 08/18/2021       Assessment & Plan:  Acute bronchitis, unspecified organism -     Promethazine -DM; Take 5 mLs by mouth 4 (four) times daily as needed.  Dispense: 118 mL; Refill: 0 -     Clarithromycin ; Take 1 tablet (500 mg total) by mouth 2 (two) times daily for 10 days.  Dispense: 20 tablet; Refill: 0 -     DG Chest 2 View; Future -     methylPREDNISolone  Acetate   Assessment and Plan Assessment & Plan Acute bronchitis   She has had a persistent cough for five weeks following a flu-like illness contracted during travel. Initial treatment with doxycycline and albuterol  was completed. The cough remains productive, with green sputum in the mornings and clear sputum currently. Significant coughing leads to pelvic floor dysfunction and sleep disturbances. The differential includes unresolved infection or post-viral cough. A steroid injection was administered to reduce inflammation and improve symptoms, as she prefers to avoid Z-Pak. A chest x-ray was ordered with stat priority to evaluate for underlying issues. Phenergan  DM was prescribed for cough management and as a sleep aid.   Jill JONELLE Shanks Chase, DO     [1]  Allergies Allergen Reactions   Amoxicillin     REACTION: rash -whelps   Prednisone     REACTION: anaphylactic   "

## 2024-08-13 ENCOUNTER — Encounter: Admitting: Family Medicine
